# Patient Record
Sex: Female | Born: 1985 | Race: White | Hispanic: No | Marital: Married | Smoking: Never smoker
Health system: Southern US, Community
[De-identification: ages and names within clinical notes are randomized; demographics above are authoritative.]

## PROBLEM LIST (undated history)

## (undated) DIAGNOSIS — F32A Depression, unspecified: Secondary | ICD-10-CM

## (undated) DIAGNOSIS — D649 Anemia, unspecified: Secondary | ICD-10-CM

## (undated) DIAGNOSIS — O24419 Gestational diabetes mellitus in pregnancy, unspecified control: Secondary | ICD-10-CM

## (undated) DIAGNOSIS — F419 Anxiety disorder, unspecified: Secondary | ICD-10-CM

## (undated) DIAGNOSIS — T7840XA Allergy, unspecified, initial encounter: Secondary | ICD-10-CM

## (undated) DIAGNOSIS — J45909 Unspecified asthma, uncomplicated: Secondary | ICD-10-CM

## (undated) HISTORY — DX: Allergy, unspecified, initial encounter: T78.40XA

## (undated) HISTORY — DX: Gestational diabetes mellitus in pregnancy, unspecified control: O24.419

---
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
## 2007-01-01 ENCOUNTER — Other Ambulatory Visit: Admission: RE | Admit: 2007-01-01 | Discharge: 2007-01-01 | Payer: Self-pay | Admitting: Family Medicine

## 2007-06-13 ENCOUNTER — Emergency Department (HOSPITAL_COMMUNITY): Admission: EM | Admit: 2007-06-13 | Discharge: 2007-06-13 | Payer: Self-pay | Admitting: Emergency Medicine

## 2010-09-28 LAB — POCT I-STAT, CHEM 8
BUN: 8
Chloride: 106
HCT: 34 — ABNORMAL LOW
Potassium: 3.8

## 2010-09-28 LAB — DIFFERENTIAL
Basophils Absolute: 0
Basophils Relative: 1
Eosinophils Absolute: 0.1
Eosinophils Relative: 1
Lymphocytes Relative: 39
Lymphs Abs: 3.3
Monocytes Absolute: 0.7
Monocytes Relative: 8
Neutro Abs: 4.3
Neutrophils Relative %: 51

## 2010-09-28 LAB — URINALYSIS, ROUTINE W REFLEX MICROSCOPIC
Bilirubin Urine: NEGATIVE
Glucose, UA: NEGATIVE
Hgb urine dipstick: NEGATIVE
Ketones, ur: NEGATIVE
Nitrite: NEGATIVE
Protein, ur: NEGATIVE
Specific Gravity, Urine: 1.004 — ABNORMAL LOW
Urobilinogen, UA: 0.2
pH: 8

## 2010-09-28 LAB — CBC
HCT: 35.2 — ABNORMAL LOW
Hemoglobin: 12.4
MCHC: 35.1
MCV: 88.9
Platelets: 234
RBC: 3.95
RDW: 12.4
WBC: 8.4

## 2010-09-28 LAB — POCT PREGNANCY, URINE
Operator id: 161631
Preg Test, Ur: NEGATIVE

## 2011-11-23 ENCOUNTER — Other Ambulatory Visit: Payer: Self-pay | Admitting: Family Medicine

## 2011-11-23 ENCOUNTER — Other Ambulatory Visit (HOSPITAL_COMMUNITY)
Admission: RE | Admit: 2011-11-23 | Discharge: 2011-11-23 | Disposition: A | Discharge status: Home / Self Care | Payer: BC Managed Care – PPO | Source: Ambulatory Visit | Attending: Family Medicine | Admitting: Family Medicine

## 2011-11-23 DIAGNOSIS — Z113 Encounter for screening for infections with a predominantly sexual mode of transmission: Secondary | ICD-10-CM | POA: Insufficient documentation

## 2011-11-23 DIAGNOSIS — Z0000 Encounter for general adult medical examination without abnormal findings: Secondary | ICD-10-CM | POA: Insufficient documentation

## 2012-11-18 ENCOUNTER — Other Ambulatory Visit (HOSPITAL_COMMUNITY)
Admission: RE | Admit: 2012-11-18 | Discharge: 2012-11-18 | Disposition: A | Discharge status: Home / Self Care | Payer: BC Managed Care – PPO | Source: Ambulatory Visit | Attending: Nurse Practitioner | Admitting: Nurse Practitioner

## 2012-11-18 DIAGNOSIS — Z01419 Encounter for gynecological examination (general) (routine) without abnormal findings: Secondary | ICD-10-CM | POA: Insufficient documentation

## 2012-11-18 DIAGNOSIS — Z113 Encounter for screening for infections with a predominantly sexual mode of transmission: Secondary | ICD-10-CM | POA: Insufficient documentation

## 2015-03-11 ENCOUNTER — Other Ambulatory Visit: Payer: Self-pay | Admitting: Obstetrics & Gynecology

## 2015-03-11 ENCOUNTER — Other Ambulatory Visit (HOSPITAL_COMMUNITY)
Admission: RE | Admit: 2015-03-11 | Discharge: 2015-03-11 | Disposition: A | Discharge status: Home / Self Care | Payer: Managed Care, Other (non HMO) | Source: Ambulatory Visit | Attending: Obstetrics & Gynecology | Admitting: Obstetrics & Gynecology

## 2015-03-11 DIAGNOSIS — Z01419 Encounter for gynecological examination (general) (routine) without abnormal findings: Secondary | ICD-10-CM | POA: Insufficient documentation

## 2015-03-16 LAB — CYTOLOGY - PAP

## 2017-12-04 DIAGNOSIS — Z0000 Encounter for general adult medical examination without abnormal findings: Secondary | ICD-10-CM | POA: Diagnosis not present

## 2017-12-04 DIAGNOSIS — Z23 Encounter for immunization: Secondary | ICD-10-CM | POA: Diagnosis not present

## 2017-12-04 DIAGNOSIS — Z136 Encounter for screening for cardiovascular disorders: Secondary | ICD-10-CM | POA: Diagnosis not present

## 2018-07-16 DIAGNOSIS — M25462 Effusion, left knee: Secondary | ICD-10-CM | POA: Diagnosis not present

## 2018-08-08 DIAGNOSIS — H04123 Dry eye syndrome of bilateral lacrimal glands: Secondary | ICD-10-CM | POA: Diagnosis not present

## 2018-08-08 DIAGNOSIS — H52223 Regular astigmatism, bilateral: Secondary | ICD-10-CM | POA: Diagnosis not present

## 2018-08-08 DIAGNOSIS — D3131 Benign neoplasm of right choroid: Secondary | ICD-10-CM | POA: Diagnosis not present

## 2018-08-08 DIAGNOSIS — H1045 Other chronic allergic conjunctivitis: Secondary | ICD-10-CM | POA: Diagnosis not present

## 2018-09-19 DIAGNOSIS — H6122 Impacted cerumen, left ear: Secondary | ICD-10-CM | POA: Diagnosis not present

## 2018-10-21 DIAGNOSIS — T781XXA Other adverse food reactions, not elsewhere classified, initial encounter: Secondary | ICD-10-CM | POA: Diagnosis not present

## 2018-10-21 DIAGNOSIS — M722 Plantar fascial fibromatosis: Secondary | ICD-10-CM | POA: Diagnosis not present

## 2018-10-21 DIAGNOSIS — Z91010 Allergy to peanuts: Secondary | ICD-10-CM | POA: Diagnosis not present

## 2018-12-08 DIAGNOSIS — Z13220 Encounter for screening for lipoid disorders: Secondary | ICD-10-CM | POA: Diagnosis not present

## 2018-12-08 DIAGNOSIS — Z0000 Encounter for general adult medical examination without abnormal findings: Secondary | ICD-10-CM | POA: Diagnosis not present

## 2019-02-01 DIAGNOSIS — R0789 Other chest pain: Secondary | ICD-10-CM | POA: Diagnosis not present

## 2019-02-01 DIAGNOSIS — M940 Chondrocostal junction syndrome [Tietze]: Secondary | ICD-10-CM | POA: Diagnosis not present

## 2019-02-05 DIAGNOSIS — Z13220 Encounter for screening for lipoid disorders: Secondary | ICD-10-CM | POA: Diagnosis not present

## 2019-02-11 DIAGNOSIS — Z01419 Encounter for gynecological examination (general) (routine) without abnormal findings: Secondary | ICD-10-CM | POA: Diagnosis not present

## 2019-02-28 DIAGNOSIS — Z713 Dietary counseling and surveillance: Secondary | ICD-10-CM | POA: Diagnosis not present

## 2019-02-28 DIAGNOSIS — Z723 Lack of physical exercise: Secondary | ICD-10-CM | POA: Diagnosis not present

## 2019-02-28 DIAGNOSIS — M722 Plantar fascial fibromatosis: Secondary | ICD-10-CM | POA: Diagnosis not present

## 2019-02-28 DIAGNOSIS — Z724 Inappropriate diet and eating habits: Secondary | ICD-10-CM | POA: Diagnosis not present

## 2019-02-28 DIAGNOSIS — E6609 Other obesity due to excess calories: Secondary | ICD-10-CM | POA: Diagnosis not present

## 2019-03-04 DIAGNOSIS — F419 Anxiety disorder, unspecified: Secondary | ICD-10-CM | POA: Diagnosis not present

## 2019-03-04 DIAGNOSIS — R9431 Abnormal electrocardiogram [ECG] [EKG]: Secondary | ICD-10-CM | POA: Diagnosis not present

## 2019-03-07 DIAGNOSIS — Z724 Inappropriate diet and eating habits: Secondary | ICD-10-CM | POA: Diagnosis not present

## 2019-03-07 DIAGNOSIS — E6609 Other obesity due to excess calories: Secondary | ICD-10-CM | POA: Diagnosis not present

## 2019-03-07 DIAGNOSIS — M722 Plantar fascial fibromatosis: Secondary | ICD-10-CM | POA: Diagnosis not present

## 2019-03-07 DIAGNOSIS — Z713 Dietary counseling and surveillance: Secondary | ICD-10-CM | POA: Diagnosis not present

## 2019-03-07 DIAGNOSIS — Z723 Lack of physical exercise: Secondary | ICD-10-CM | POA: Diagnosis not present

## 2019-03-14 DIAGNOSIS — Z723 Lack of physical exercise: Secondary | ICD-10-CM | POA: Diagnosis not present

## 2019-03-14 DIAGNOSIS — Z713 Dietary counseling and surveillance: Secondary | ICD-10-CM | POA: Diagnosis not present

## 2019-03-14 DIAGNOSIS — M722 Plantar fascial fibromatosis: Secondary | ICD-10-CM | POA: Diagnosis not present

## 2019-03-14 DIAGNOSIS — Z724 Inappropriate diet and eating habits: Secondary | ICD-10-CM | POA: Diagnosis not present

## 2019-03-14 DIAGNOSIS — E6609 Other obesity due to excess calories: Secondary | ICD-10-CM | POA: Diagnosis not present

## 2019-03-21 DIAGNOSIS — E6609 Other obesity due to excess calories: Secondary | ICD-10-CM | POA: Diagnosis not present

## 2019-03-21 DIAGNOSIS — M722 Plantar fascial fibromatosis: Secondary | ICD-10-CM | POA: Diagnosis not present

## 2019-03-21 DIAGNOSIS — Z713 Dietary counseling and surveillance: Secondary | ICD-10-CM | POA: Diagnosis not present

## 2019-03-21 DIAGNOSIS — Z723 Lack of physical exercise: Secondary | ICD-10-CM | POA: Diagnosis not present

## 2019-03-21 DIAGNOSIS — Z724 Inappropriate diet and eating habits: Secondary | ICD-10-CM | POA: Diagnosis not present

## 2019-04-01 DIAGNOSIS — F419 Anxiety disorder, unspecified: Secondary | ICD-10-CM | POA: Diagnosis not present

## 2019-04-04 DIAGNOSIS — M722 Plantar fascial fibromatosis: Secondary | ICD-10-CM | POA: Diagnosis not present

## 2019-04-04 DIAGNOSIS — Z713 Dietary counseling and surveillance: Secondary | ICD-10-CM | POA: Diagnosis not present

## 2019-04-04 DIAGNOSIS — E6609 Other obesity due to excess calories: Secondary | ICD-10-CM | POA: Diagnosis not present

## 2019-04-04 DIAGNOSIS — Z723 Lack of physical exercise: Secondary | ICD-10-CM | POA: Diagnosis not present

## 2019-04-04 DIAGNOSIS — Z724 Inappropriate diet and eating habits: Secondary | ICD-10-CM | POA: Diagnosis not present

## 2019-04-11 DIAGNOSIS — Z713 Dietary counseling and surveillance: Secondary | ICD-10-CM | POA: Diagnosis not present

## 2019-04-11 DIAGNOSIS — Z724 Inappropriate diet and eating habits: Secondary | ICD-10-CM | POA: Diagnosis not present

## 2019-04-11 DIAGNOSIS — E6609 Other obesity due to excess calories: Secondary | ICD-10-CM | POA: Diagnosis not present

## 2019-04-11 DIAGNOSIS — Z723 Lack of physical exercise: Secondary | ICD-10-CM | POA: Diagnosis not present

## 2019-04-11 DIAGNOSIS — M722 Plantar fascial fibromatosis: Secondary | ICD-10-CM | POA: Diagnosis not present

## 2019-04-18 DIAGNOSIS — Z723 Lack of physical exercise: Secondary | ICD-10-CM | POA: Diagnosis not present

## 2019-04-18 DIAGNOSIS — Z724 Inappropriate diet and eating habits: Secondary | ICD-10-CM | POA: Diagnosis not present

## 2019-04-18 DIAGNOSIS — M722 Plantar fascial fibromatosis: Secondary | ICD-10-CM | POA: Diagnosis not present

## 2019-04-18 DIAGNOSIS — E6609 Other obesity due to excess calories: Secondary | ICD-10-CM | POA: Diagnosis not present

## 2019-04-18 DIAGNOSIS — Z713 Dietary counseling and surveillance: Secondary | ICD-10-CM | POA: Diagnosis not present

## 2019-04-25 DIAGNOSIS — M722 Plantar fascial fibromatosis: Secondary | ICD-10-CM | POA: Diagnosis not present

## 2019-04-25 DIAGNOSIS — Z713 Dietary counseling and surveillance: Secondary | ICD-10-CM | POA: Diagnosis not present

## 2019-04-25 DIAGNOSIS — Z724 Inappropriate diet and eating habits: Secondary | ICD-10-CM | POA: Diagnosis not present

## 2019-04-25 DIAGNOSIS — E6609 Other obesity due to excess calories: Secondary | ICD-10-CM | POA: Diagnosis not present

## 2019-04-25 DIAGNOSIS — Z723 Lack of physical exercise: Secondary | ICD-10-CM | POA: Diagnosis not present

## 2019-05-02 DIAGNOSIS — Z724 Inappropriate diet and eating habits: Secondary | ICD-10-CM | POA: Diagnosis not present

## 2019-05-02 DIAGNOSIS — M722 Plantar fascial fibromatosis: Secondary | ICD-10-CM | POA: Diagnosis not present

## 2019-05-02 DIAGNOSIS — Z713 Dietary counseling and surveillance: Secondary | ICD-10-CM | POA: Diagnosis not present

## 2019-05-02 DIAGNOSIS — E6609 Other obesity due to excess calories: Secondary | ICD-10-CM | POA: Diagnosis not present

## 2019-05-02 DIAGNOSIS — Z723 Lack of physical exercise: Secondary | ICD-10-CM | POA: Diagnosis not present

## 2019-05-04 ENCOUNTER — Encounter: Payer: Self-pay | Admitting: Cardiovascular Disease

## 2019-05-04 DIAGNOSIS — R55 Syncope and collapse: Secondary | ICD-10-CM | POA: Diagnosis not present

## 2019-05-04 DIAGNOSIS — R946 Abnormal results of thyroid function studies: Secondary | ICD-10-CM | POA: Diagnosis not present

## 2019-05-04 DIAGNOSIS — R079 Chest pain, unspecified: Secondary | ICD-10-CM | POA: Diagnosis not present

## 2019-05-04 DIAGNOSIS — Z87898 Personal history of other specified conditions: Secondary | ICD-10-CM | POA: Diagnosis not present

## 2019-05-06 NOTE — Progress Notes (Signed)
Cardiology Office Note:   Date:  05/07/2019  NAME:  Marie Morris    MRN: [id number] DOB:  Jun 28, 1985   PCP:  Wilfrid Lund, PA  Cardiologist:  No primary care provider on file.   Referring MD: Aliene Beams, MD   Chief Complaint  Patient presents with  . Near Syncope    History of Present Illness:   Marie Morris is a 34 y.o. female with a hx of allergies who is being seen today for the evaluation of dizziness/presyncope at the request of Wilfrid Lund, [place].  She presents for evaluation of dizziness and presyncope.  On Sunday of this week she reports she was walking at [employer] around 11:30 AM and became very dizzy and lightheaded.  She reports she felt she was in a pass out but did not.  She was approached by a medical professional who instructed her to sit down and lay down the ground.  She apparently felt unwell most of the day.  She was evaluated by emergency medical services and her blood pressure was noted to be 140/100 and blood sugar 115.  This was after consuming some sugary stuff.  She reports do not have any chest pain or shortness of breath with the episode.  She denies any sensation of rapid heartbeat.  She reports that she just did not feel well most of the day.  Of note in her history she is recently started a weight loss program that includes around 900 to 1200 cal/day.  She reports has been staying well-hydrated.  Apparently all she ate on the day in question was the yogurt that morning.  She does report that she does drink Gatorade 0 and other things to maintain hydration as she works in his diet.  She does exercise on a Peloton several days per week and has no issues doing that.  Her EKG today demonstrates normal sinus rhythm with no acute changes.  Her primary care physician mentions a prolonged QT interval which she does not have.  Her EKG is very normal.  She reports that her father has a history of heart disease.  She is a never smoker.  She rarely consumes alcohol.  No  illicit drug use.  She works as an Airline pilot.  She is married with no children and they have we will try to have children soon.  Most recent thyroid studies from primary care physician office 2.8.  Most recent hemoglobin 13.5.  Review of lipid profile from 2021 is unremarkable.  Her cardiovascular exam is unremarkable as well.  Past Medical History: Past Medical History:  Diagnosis Date  . Allergies     Past Surgical History: History reviewed. No pertinent surgical history.  Current Medications: Current Meds  Medication Sig  . cetirizine (ZYRTEC) 10 MG tablet Take 10 mg by mouth daily.  Marland Kitchen EPINEPHrine 0.3 mg/0.3 mL IJ SOAJ injection See admin instructions.  Marland Kitchen FLUoxetine (PROZAC) 20 MG capsule Take 20 mg by mouth daily.  . Melatonin-Pyridoxine (MELATIN PO) Take 3 mg by mouth.  . Prenatal Vit-Fe Fum-FA-Omega (PRENATAL MULTI +DHA PO) Take by mouth.  Marland Kitchen Specialty Vitamins Products (WEIGHT LOSS DAILY MULTI PO) Take by mouth. Patient takes 3 different weight loss pills OTC  1. Inner balance  2. Fat burner  3. Stay slim  Patient states that the brand call medi weight loss     Allergies:    Patient has no known allergies.   Social History: Social History   Socioeconomic History  .  Marital status: Married    Spouse name: Not on file  . Number of children: Not on file  . Years of education: Not on file  . Highest education level: Not on file  Occupational History  . Not on file  Tobacco Use  . Smoking status: Never Smoker  . Smokeless tobacco: Never Used  Substance and Sexual Activity  . Alcohol use: Not Currently  . Drug use: Never  . Sexual activity: Not on file  Other Topics Concern  . Not on file  Social History Narrative  . Not on file   Social Determinants of Health   Financial Resource Strain:   . Difficulty of Paying Living Expenses:   Food Insecurity:   . Worried About Charity fundraiser in the Last Year:   . Arboriculturist in the Last Year:   Transportation  Needs:   . Film/video editor (Medical):   Marland Kitchen Lack of Transportation (Non-Medical):   Physical Activity:   . Days of Exercise per Week:   . Minutes of Exercise per Session:   Stress:   . Feeling of Stress :   Social Connections:   . Frequency of Communication with Friends and Family:   . Frequency of Social Gatherings with Friends and Family:   . Attends Religious Services:   . Active Member of Clubs or Organizations:   . Attends Archivist Meetings:   Marland Kitchen Marital Status:      Family History: The patient's family history includes Hypertension in her father.  ROS:   All other ROS reviewed and negative. Pertinent positives noted in the HPI.     EKGs/Labs/Other Studies Reviewed:   The following studies were personally reviewed by me today:  EKG:  EKG is ordered today.  The ekg ordered today demonstrates normal sinus rhythm, heart rate 77, no acute ischemic changes, normal intervals, QTC 425, and was personally reviewed by me.   Recent Labs: No results found for requested labs within last 8760 hours.   Recent Lipid Panel No results found for: CHOL, TRIG, HDL, CHOLHDL, VLDL, LDLCALC, LDLDIRECT  Physical Exam:   VS:  BP 110/86   Pulse 77   Ht 5\' 1"  (1.549 m)   Wt 164 lb (74.4 kg)   SpO2 99%   BMI 30.99 kg/m    Wt Readings from Last 3 Encounters:  05/07/19 164 lb (74.4 kg)    General: Well nourished, well developed, in no acute distress Heart: Atraumatic, normal size  Eyes: PEERLA, EOMI  Neck: Supple, no JVD Endocrine: No thryomegaly Cardiac: Normal S1, S2; RRR; no murmurs, rubs, or gallops Lungs: Clear to auscultation bilaterally, no wheezing, rhonchi or rales  Abd: Soft, nontender, no hepatomegaly  Ext: No edema, pulses 2+ Musculoskeletal: No deformities, BUE and BLE strength normal and equal Skin: Warm and dry, no rashes   Neuro: Alert and oriented to person, place, time, and situation, CNII-XII grossly intact, no focal deficits  Psych: Normal mood and  affect   ASSESSMENT:   Marie Morris is a 34 y.o. female who presents for the following: 1. Postural dizziness with presyncope     PLAN:   1. Postural dizziness with presyncope -She presents after a presyncopal episode.  She had no alarming features such as syncope, chest pain, shortness of breath or palpitations.  Her EKG today is normal.  She has no evidence of prolongation of her QT interval.  All of her intervals are normal.  Thyroid studies by primary care  physician were normal.  She has recently started a pretty intense crash diet where she consumes only 900 to 1200 cal/day.  She does remain well-hydrated but I do suspect this is the issue here.  She apparently woke up that morning and went to [employer] was walking around when she felt symptoms of presyncope.  The dizziness and lightheadedness are no alarming to me.  She did have blurry vision.  This is also consistent with likely a vasovagal event she was about to have.  She did circumvent this and did not feel well most of the day.  Her EKG is normal, her cardiovascular exam is without murmurs rubs or gallops.  I have recommended no further cardiac work-up.  I recommended to stay well-hydrated and consume a fair amount of food before she does exercise.  She will see [place] as needed.   Disposition: Return if symptoms worsen or fail to improve.  Medication Adjustments/Labs and Tests Ordered: Current medicines are reviewed at length with the patient today.  Concerns regarding medicines are outlined above.  Orders Placed This Encounter  Procedures  . EKG 12-Lead   No orders of the defined types were placed in this encounter.   Patient Instructions  Medication Instructions:  The current medical regimen is effective;  continue present plan and medications.  *If you need a refill on your cardiac medications before your next appointment, please call your pharmacy*   Follow-Up: At [facility], you and your health needs are our priority.   As part of our continuing mission to provide you with exceptional heart care, we have created designated Provider Care Teams.  These Care Teams include your primary Cardiologist (physician) and Advanced Practice Providers (APPs -  Physician Assistants and Nurse Practitioners) who all work together to provide you with the care you need, when you need it.  We recommend signing up for the patient portal called "MyChart".  Sign up information is provided on this After Visit Summary.  MyChart is used to connect with patients for Virtual Visits (Telemedicine).  Patients are able to view lab/test results, encounter notes, upcoming appointments, etc.  Non-urgent messages can be sent to your provider as well.   To learn more about what you can do with MyChart, go to [email or web address].    Your next appointment:   As needed  The format for your next appointment:   In Person  Provider:   Lennie Odor, MD        Signed, Lenna Gilford. Flora Lipps, MD [facility]  [address], [address] [place], [place] [address] [phone]  05/07/2019 10:06 AM

## 2019-05-07 ENCOUNTER — Encounter: Payer: Self-pay | Admitting: Cardiovascular Disease

## 2019-05-07 ENCOUNTER — Other Ambulatory Visit: Payer: Self-pay

## 2019-05-07 ENCOUNTER — Ambulatory Visit (INDEPENDENT_AMBULATORY_CARE_PROVIDER_SITE_OTHER): Payer: BC Managed Care – PPO | Admitting: Cardiovascular Disease

## 2019-05-07 VITALS — BP 110/86 | HR 77 | Ht 61.0 in | Wt 164.0 lb

## 2019-05-07 DIAGNOSIS — R42 Dizziness and giddiness: Secondary | ICD-10-CM | POA: Diagnosis not present

## 2019-05-07 DIAGNOSIS — R55 Syncope and collapse: Secondary | ICD-10-CM | POA: Diagnosis not present

## 2019-05-07 NOTE — Patient Instructions (Signed)
Medication Instructions:  The current medical regimen is effective;  continue present plan and medications.  *If you need a refill on your cardiac medications before your next appointment, please call your pharmacy*    Follow-Up: At [facility], you and your health needs are our priority.  As part of our continuing mission to provide you with exceptional heart care, we have created designated Provider Care Teams.  These Care Teams include your primary Cardiologist (physician) and Advanced Practice Providers (APPs -  Physician Assistants and Nurse Practitioners) who all work together to provide you with the care you need, when you need it.  We recommend signing up for the patient portal called "MyChart".  Sign up information is provided on this After Visit Summary.  MyChart is used to connect with patients for Virtual Visits (Telemedicine).  Patients are able to view lab/test results, encounter notes, upcoming appointments, etc.  Non-urgent messages can be sent to your provider as well.   To learn more about what you can do with MyChart, go to [email or web address].    Your next appointment:   As needed  The format for your next appointment:   In Person  Provider:   [place] O'[place], MD      

## 2019-05-09 DIAGNOSIS — E6609 Other obesity due to excess calories: Secondary | ICD-10-CM | POA: Diagnosis not present

## 2019-05-09 DIAGNOSIS — Z723 Lack of physical exercise: Secondary | ICD-10-CM | POA: Diagnosis not present

## 2019-05-09 DIAGNOSIS — Z724 Inappropriate diet and eating habits: Secondary | ICD-10-CM | POA: Diagnosis not present

## 2019-05-09 DIAGNOSIS — Z713 Dietary counseling and surveillance: Secondary | ICD-10-CM | POA: Diagnosis not present

## 2019-05-09 DIAGNOSIS — M722 Plantar fascial fibromatosis: Secondary | ICD-10-CM | POA: Diagnosis not present

## 2019-05-16 DIAGNOSIS — Z724 Inappropriate diet and eating habits: Secondary | ICD-10-CM | POA: Diagnosis not present

## 2019-05-16 DIAGNOSIS — Z723 Lack of physical exercise: Secondary | ICD-10-CM | POA: Diagnosis not present

## 2019-05-16 DIAGNOSIS — E6609 Other obesity due to excess calories: Secondary | ICD-10-CM | POA: Diagnosis not present

## 2019-05-16 DIAGNOSIS — M722 Plantar fascial fibromatosis: Secondary | ICD-10-CM | POA: Diagnosis not present

## 2019-05-16 DIAGNOSIS — Z713 Dietary counseling and surveillance: Secondary | ICD-10-CM | POA: Diagnosis not present

## 2019-05-23 DIAGNOSIS — E6609 Other obesity due to excess calories: Secondary | ICD-10-CM | POA: Diagnosis not present

## 2019-05-23 DIAGNOSIS — Z723 Lack of physical exercise: Secondary | ICD-10-CM | POA: Diagnosis not present

## 2019-05-23 DIAGNOSIS — Z724 Inappropriate diet and eating habits: Secondary | ICD-10-CM | POA: Diagnosis not present

## 2019-05-23 DIAGNOSIS — Z713 Dietary counseling and surveillance: Secondary | ICD-10-CM | POA: Diagnosis not present

## 2019-05-23 DIAGNOSIS — M722 Plantar fascial fibromatosis: Secondary | ICD-10-CM | POA: Diagnosis not present

## 2019-05-30 DIAGNOSIS — Z723 Lack of physical exercise: Secondary | ICD-10-CM | POA: Diagnosis not present

## 2019-05-30 DIAGNOSIS — Z724 Inappropriate diet and eating habits: Secondary | ICD-10-CM | POA: Diagnosis not present

## 2019-05-30 DIAGNOSIS — M722 Plantar fascial fibromatosis: Secondary | ICD-10-CM | POA: Diagnosis not present

## 2019-05-30 DIAGNOSIS — Z713 Dietary counseling and surveillance: Secondary | ICD-10-CM | POA: Diagnosis not present

## 2019-05-30 DIAGNOSIS — E663 Overweight: Secondary | ICD-10-CM | POA: Diagnosis not present

## 2019-06-19 DIAGNOSIS — B373 Candidiasis of vulva and vagina: Secondary | ICD-10-CM | POA: Diagnosis not present

## 2019-06-19 DIAGNOSIS — N9089 Other specified noninflammatory disorders of vulva and perineum: Secondary | ICD-10-CM | POA: Diagnosis not present

## 2019-10-06 DIAGNOSIS — Z630 Problems in relationship with spouse or partner: Secondary | ICD-10-CM | POA: Diagnosis not present

## 2019-10-06 DIAGNOSIS — F411 Generalized anxiety disorder: Secondary | ICD-10-CM | POA: Diagnosis not present

## 2019-10-15 DIAGNOSIS — E669 Obesity, unspecified: Secondary | ICD-10-CM | POA: Diagnosis not present

## 2019-10-15 DIAGNOSIS — Z30432 Encounter for removal of intrauterine contraceptive device: Secondary | ICD-10-CM | POA: Diagnosis not present

## 2019-10-15 DIAGNOSIS — E559 Vitamin D deficiency, unspecified: Secondary | ICD-10-CM | POA: Diagnosis not present

## 2019-10-15 DIAGNOSIS — Z319 Encounter for procreative management, unspecified: Secondary | ICD-10-CM | POA: Diagnosis not present

## 2019-10-15 DIAGNOSIS — Z3169 Encounter for other general counseling and advice on procreation: Secondary | ICD-10-CM | POA: Diagnosis not present

## 2019-10-15 DIAGNOSIS — Z315 Encounter for genetic counseling: Secondary | ICD-10-CM | POA: Diagnosis not present

## 2019-11-03 DIAGNOSIS — F411 Generalized anxiety disorder: Secondary | ICD-10-CM | POA: Diagnosis not present

## 2019-11-03 DIAGNOSIS — Z630 Problems in relationship with spouse or partner: Secondary | ICD-10-CM | POA: Diagnosis not present

## 2019-11-20 DIAGNOSIS — Z20822 Contact with and (suspected) exposure to covid-19: Secondary | ICD-10-CM | POA: Diagnosis not present

## 2019-11-25 DIAGNOSIS — L309 Dermatitis, unspecified: Secondary | ICD-10-CM | POA: Diagnosis not present

## 2019-11-25 DIAGNOSIS — J309 Allergic rhinitis, unspecified: Secondary | ICD-10-CM | POA: Diagnosis not present

## 2019-12-10 DIAGNOSIS — Z630 Problems in relationship with spouse or partner: Secondary | ICD-10-CM | POA: Diagnosis not present

## 2019-12-10 DIAGNOSIS — F411 Generalized anxiety disorder: Secondary | ICD-10-CM | POA: Diagnosis not present

## 2019-12-21 DIAGNOSIS — Z3201 Encounter for pregnancy test, result positive: Secondary | ICD-10-CM | POA: Diagnosis not present

## 2019-12-21 DIAGNOSIS — Z3400 Encounter for supervision of normal first pregnancy, unspecified trimester: Secondary | ICD-10-CM | POA: Diagnosis not present

## 2019-12-21 DIAGNOSIS — O26899 Other specified pregnancy related conditions, unspecified trimester: Secondary | ICD-10-CM | POA: Diagnosis not present

## 2019-12-29 DIAGNOSIS — Z20822 Contact with and (suspected) exposure to covid-19: Secondary | ICD-10-CM | POA: Diagnosis not present

## 2020-01-02 DIAGNOSIS — O24419 Gestational diabetes mellitus in pregnancy, unspecified control: Secondary | ICD-10-CM

## 2020-01-02 HISTORY — DX: Gestational diabetes mellitus in pregnancy, unspecified control: O24.419

## 2020-01-02 NOTE — L&D Delivery Note (Signed)
Delivery Note Arrived to room to evaluate for cervical change. Patient was found to be complete and 0 to +1 station. The bed was broken down. Fetal head was delivered. No nuchal was present. Shoulders and body followed without difficulty. Infant was placed on mother's chest, mouth and nose were bulb suctioned and let out a vigorous cry. Delayed cord clamping was performed for 60 seconds. Venous cord blood was collected. Placenta delivered spontaneously. Cervix, vagina, perineum and labia were inspected. Uterine fundus firm. A deep second degree laceration was found. A rectal examination revealed intact rectal mucosa and external sphincter intact. After change of sterile gloves, the second degree laceration was repaired using 3-0 Vicryl. There was significant oozing from multiple sites which were made hemostatic using 3-0 Vicryl using figure-of-eights. Cytotec was placed per rectum as prophylaxis and 1g TXA IV administered. Most of the blood loss was from the vaginal laceration. Placenta was inspected, found to be intact with a 3 vessel cord. Counts were correct.   At 11:08 AM a viable female was delivered via Vaginal, Spontaneous (Presentation:   Left Occiput Anterior).  APGAR: 9, 9; weight: see infant's chart.   Placenta status: Spontaneous, Intact.  Cord: 3 vessels with the following complications: None.  Cord pH: Not collected  Anesthesia: Epidural Episiotomy: None Lacerations: 2nd degree;Perineal Suture Repair: 3.0 vicryl Est. Blood Loss (mL):  821  Mom to postpartum.  Baby to Couplet care / Skin to Skin.  Steva Ready 08/14/2020, 11:54 AM

## 2020-01-04 DIAGNOSIS — O26899 Other specified pregnancy related conditions, unspecified trimester: Secondary | ICD-10-CM | POA: Diagnosis not present

## 2020-01-05 DIAGNOSIS — Z630 Problems in relationship with spouse or partner: Secondary | ICD-10-CM | POA: Diagnosis not present

## 2020-01-05 DIAGNOSIS — F411 Generalized anxiety disorder: Secondary | ICD-10-CM | POA: Diagnosis not present

## 2020-01-12 LAB — OB RESULTS CONSOLE HEPATITIS B SURFACE ANTIGEN: Hepatitis B Surface Ag: NEGATIVE

## 2020-01-12 LAB — OB RESULTS CONSOLE ABO/RH: RH Type: POSITIVE

## 2020-01-12 LAB — OB RESULTS CONSOLE GC/CHLAMYDIA
Chlamydia: NEGATIVE
Gonorrhea: NEGATIVE

## 2020-01-12 LAB — OB RESULTS CONSOLE RPR: RPR: NONREACTIVE

## 2020-01-12 LAB — OB RESULTS CONSOLE RUBELLA ANTIBODY, IGM: Rubella: IMMUNE

## 2020-01-12 LAB — OB RESULTS CONSOLE ANTIBODY SCREEN: Antibody Screen: NEGATIVE

## 2020-01-12 LAB — OB RESULTS CONSOLE HIV ANTIBODY (ROUTINE TESTING): HIV: NONREACTIVE

## 2020-01-13 DIAGNOSIS — R0981 Nasal congestion: Secondary | ICD-10-CM | POA: Diagnosis not present

## 2020-01-13 DIAGNOSIS — R6883 Chills (without fever): Secondary | ICD-10-CM | POA: Diagnosis not present

## 2020-01-13 DIAGNOSIS — B349 Viral infection, unspecified: Secondary | ICD-10-CM | POA: Diagnosis not present

## 2020-01-29 DIAGNOSIS — Z3490 Encounter for supervision of normal pregnancy, unspecified, unspecified trimester: Secondary | ICD-10-CM | POA: Diagnosis not present

## 2020-02-02 DIAGNOSIS — F411 Generalized anxiety disorder: Secondary | ICD-10-CM | POA: Diagnosis not present

## 2020-02-02 DIAGNOSIS — Z630 Problems in relationship with spouse or partner: Secondary | ICD-10-CM | POA: Diagnosis not present

## 2020-02-25 DIAGNOSIS — N898 Other specified noninflammatory disorders of vagina: Secondary | ICD-10-CM | POA: Diagnosis not present

## 2020-03-01 DIAGNOSIS — F411 Generalized anxiety disorder: Secondary | ICD-10-CM | POA: Diagnosis not present

## 2020-03-01 DIAGNOSIS — Z630 Problems in relationship with spouse or partner: Secondary | ICD-10-CM | POA: Diagnosis not present

## 2020-03-28 DIAGNOSIS — Z3490 Encounter for supervision of normal pregnancy, unspecified, unspecified trimester: Secondary | ICD-10-CM | POA: Diagnosis not present

## 2020-03-28 DIAGNOSIS — Z36 Encounter for antenatal screening for chromosomal anomalies: Secondary | ICD-10-CM | POA: Diagnosis not present

## 2020-03-28 DIAGNOSIS — Z3402 Encounter for supervision of normal first pregnancy, second trimester: Secondary | ICD-10-CM | POA: Diagnosis not present

## 2020-04-05 DIAGNOSIS — F411 Generalized anxiety disorder: Secondary | ICD-10-CM | POA: Diagnosis not present

## 2020-04-05 DIAGNOSIS — Z630 Problems in relationship with spouse or partner: Secondary | ICD-10-CM | POA: Diagnosis not present

## 2020-04-29 DIAGNOSIS — Z3402 Encounter for supervision of normal first pregnancy, second trimester: Secondary | ICD-10-CM | POA: Diagnosis not present

## 2020-04-29 DIAGNOSIS — Z3490 Encounter for supervision of normal pregnancy, unspecified, unspecified trimester: Secondary | ICD-10-CM | POA: Diagnosis not present

## 2020-05-03 DIAGNOSIS — Z630 Problems in relationship with spouse or partner: Secondary | ICD-10-CM | POA: Diagnosis not present

## 2020-05-03 DIAGNOSIS — F411 Generalized anxiety disorder: Secondary | ICD-10-CM | POA: Diagnosis not present

## 2020-05-05 DIAGNOSIS — O99810 Abnormal glucose complicating pregnancy: Secondary | ICD-10-CM | POA: Diagnosis not present

## 2020-05-18 ENCOUNTER — Encounter: Payer: BC Managed Care – PPO | Attending: Obstetrics and Gynecology | Admitting: Registered"

## 2020-05-18 ENCOUNTER — Other Ambulatory Visit: Payer: Self-pay

## 2020-05-18 ENCOUNTER — Encounter: Payer: Self-pay | Admitting: Registered"

## 2020-05-18 DIAGNOSIS — O24419 Gestational diabetes mellitus in pregnancy, unspecified control: Secondary | ICD-10-CM | POA: Diagnosis not present

## 2020-05-18 NOTE — Progress Notes (Signed)
Patient was seen on 05/18/2020 for Gestational Diabetes self-management class at the [facility]. The following learning objectives were met by the patient during this course:   States the definition of Gestational Diabetes  States why dietary management is important in controlling blood glucose  Describes the effects each nutrient has on blood glucose levels  Demonstrates ability to create a balanced meal plan  Demonstrates carbohydrate counting   States when to check blood glucose levels  Demonstrates proper blood glucose monitoring techniques  States the effect of stress and exercise on blood glucose levels  States the importance of limiting caffeine and abstaining from alcohol and smoking  Blood glucose monitor given: Patient has meter and is checking blood sugar prior to class  Patient instructed to monitor glucose levels: FBS: 60 - <95; 1 hour: <140; 2 hour: <120  Patient received handouts:  Nutrition Diabetes and Pregnancy, including carb counting list  Patient will be seen for follow-up as needed. 

## 2020-06-02 DIAGNOSIS — F411 Generalized anxiety disorder: Secondary | ICD-10-CM | POA: Diagnosis not present

## 2020-06-02 DIAGNOSIS — Z630 Problems in relationship with spouse or partner: Secondary | ICD-10-CM | POA: Diagnosis not present

## 2020-06-07 DIAGNOSIS — O99213 Obesity complicating pregnancy, third trimester: Secondary | ICD-10-CM | POA: Diagnosis not present

## 2020-06-07 DIAGNOSIS — Z23 Encounter for immunization: Secondary | ICD-10-CM | POA: Diagnosis not present

## 2020-06-07 DIAGNOSIS — O24913 Unspecified diabetes mellitus in pregnancy, third trimester: Secondary | ICD-10-CM | POA: Diagnosis not present

## 2020-06-20 DIAGNOSIS — O99213 Obesity complicating pregnancy, third trimester: Secondary | ICD-10-CM | POA: Diagnosis not present

## 2020-06-20 DIAGNOSIS — O24913 Unspecified diabetes mellitus in pregnancy, third trimester: Secondary | ICD-10-CM | POA: Diagnosis not present

## 2020-06-27 DIAGNOSIS — O24913 Unspecified diabetes mellitus in pregnancy, third trimester: Secondary | ICD-10-CM | POA: Diagnosis not present

## 2020-06-27 DIAGNOSIS — O99213 Obesity complicating pregnancy, third trimester: Secondary | ICD-10-CM | POA: Diagnosis not present

## 2020-06-27 DIAGNOSIS — R030 Elevated blood-pressure reading, without diagnosis of hypertension: Secondary | ICD-10-CM | POA: Diagnosis not present

## 2020-07-05 DIAGNOSIS — O24913 Unspecified diabetes mellitus in pregnancy, third trimester: Secondary | ICD-10-CM | POA: Diagnosis not present

## 2020-07-05 DIAGNOSIS — O99213 Obesity complicating pregnancy, third trimester: Secondary | ICD-10-CM | POA: Diagnosis not present

## 2020-07-07 DIAGNOSIS — F411 Generalized anxiety disorder: Secondary | ICD-10-CM | POA: Diagnosis not present

## 2020-07-07 DIAGNOSIS — Z630 Problems in relationship with spouse or partner: Secondary | ICD-10-CM | POA: Diagnosis not present

## 2020-07-12 DIAGNOSIS — Z3490 Encounter for supervision of normal pregnancy, unspecified, unspecified trimester: Secondary | ICD-10-CM | POA: Diagnosis not present

## 2020-07-12 DIAGNOSIS — Z3403 Encounter for supervision of normal first pregnancy, third trimester: Secondary | ICD-10-CM | POA: Diagnosis not present

## 2020-07-12 DIAGNOSIS — O99213 Obesity complicating pregnancy, third trimester: Secondary | ICD-10-CM | POA: Diagnosis not present

## 2020-07-12 DIAGNOSIS — O24913 Unspecified diabetes mellitus in pregnancy, third trimester: Secondary | ICD-10-CM | POA: Diagnosis not present

## 2020-07-18 DIAGNOSIS — O99213 Obesity complicating pregnancy, third trimester: Secondary | ICD-10-CM | POA: Diagnosis not present

## 2020-07-18 DIAGNOSIS — O24913 Unspecified diabetes mellitus in pregnancy, third trimester: Secondary | ICD-10-CM | POA: Diagnosis not present

## 2020-07-25 DIAGNOSIS — O24913 Unspecified diabetes mellitus in pregnancy, third trimester: Secondary | ICD-10-CM | POA: Diagnosis not present

## 2020-07-25 DIAGNOSIS — O99213 Obesity complicating pregnancy, third trimester: Secondary | ICD-10-CM | POA: Diagnosis not present

## 2020-07-26 DIAGNOSIS — M6281 Muscle weakness (generalized): Secondary | ICD-10-CM | POA: Diagnosis not present

## 2020-08-01 DIAGNOSIS — O99213 Obesity complicating pregnancy, third trimester: Secondary | ICD-10-CM | POA: Diagnosis not present

## 2020-08-01 DIAGNOSIS — O24913 Unspecified diabetes mellitus in pregnancy, third trimester: Secondary | ICD-10-CM | POA: Diagnosis not present

## 2020-08-04 DIAGNOSIS — Z630 Problems in relationship with spouse or partner: Secondary | ICD-10-CM | POA: Diagnosis not present

## 2020-08-04 DIAGNOSIS — F411 Generalized anxiety disorder: Secondary | ICD-10-CM | POA: Diagnosis not present

## 2020-08-05 ENCOUNTER — Telehealth (HOSPITAL_COMMUNITY): Payer: Self-pay | Admitting: *Deleted

## 2020-08-05 ENCOUNTER — Encounter (HOSPITAL_COMMUNITY): Payer: Self-pay | Admitting: *Deleted

## 2020-08-05 NOTE — Telephone Encounter (Signed)
Preadmission screen  

## 2020-08-09 DIAGNOSIS — O24913 Unspecified diabetes mellitus in pregnancy, third trimester: Secondary | ICD-10-CM | POA: Diagnosis not present

## 2020-08-10 ENCOUNTER — Other Ambulatory Visit: Payer: Self-pay | Admitting: Obstetrics and Gynecology

## 2020-08-10 LAB — SARS CORONAVIRUS 2 (TAT 6-24 HRS): SARS Coronavirus 2: NEGATIVE

## 2020-08-10 NOTE — H&P (Signed)
HPI: 35 y.o. G1P0 @ [redacted]w[redacted]d estimated gestational age (as dated by LMP c/w 7 week ultrasound) presents for induction of labor for Class A2DM.  Leakage of fluid:  No Vaginal bleeding:  No Contractions:  No Fetal movement:  Yes  Prenatal care has been provided by Dr. Steva Ready [facility])  ROS:  Denies fevers, chills, chest pain, visual changes, SOB, RUQ/epigastric pain, N/V, dysuria, hematuria, or sudden onset/worsening bilateral LE or facial edema.  Pregnancy complicated by: Class A2DM Obesity (BMI 34) Anxiety   Prenatal Transfer Tool  Maternal Diabetes: Yes:  Diabetes Type:  Insulin/Medication controlled Genetic Screening: Normal Maternal Ultrasounds/Referrals: Normal Fetal Ultrasounds or other Referrals:  None Maternal Substance Abuse:  No Significant Maternal Medications:  Meds include: Other:  Metformin 500mg  qAM and 500mg  qPM, Fluoxetine 20mg  daily Significant Maternal Lab Results: Group B Strep negative   Prenatal Labs Blood type:  O Positive Antibody screen:  Negative CBC:  H/H 11.5/32.0 Rubella: Immune RPR:  Non-reactive Hep B:  Negative Hep C:  Negative HIV:  Negative GC/CT:  Negative Glucola: 164  3h GTT abnormal  Immunizations: Tdap: Given prenatally Flu: Received Nov 2021  OBHx:  OB History     Gravida  1   Para      Term      Preterm      AB      Living         SAB      IAB      Ectopic      Multiple      Live Births             PMHx:  See above Meds:  PNV, Metformin 500mg  qAM and 1000mg  qPM, Fluoxetine 20mg  daily Allergy:   Allergies  Allergen Reactions   Almond (Diagnostic)    Apple    Cherry    Hazelnut ([place]) Allergy Skin Test    Peanut (Diagnostic)    Plum Pulp    SurgHx: None SocHx:   Denies Tobacco, ETOH, illicit drugs  O: There were no vitals taken for this visit. Gen. AAOx3, NAD CV.  RRR  Resp. CTAB, no wheezes/rales/rhonchi Abd. Gravid, soft, non-tender throughout, no rebound/guarding Extr.   Trace bilateral LE edema, no calf tenderness bilaterally SVE: closed/thick/high    Last (8/9): EFW 3846g (81%), AAFV, cephalic, anterior placenta, BPP 8/8    Labs: see orders  A/P:  35 y.o. G1P0 @ [redacted]w[redacted]d who is admitted for induction of labor for Class A2DM.  IOL - Class A2DM - Admit to L&D - Admit labs (CBC, T&S, COVID screen) - CEFM/Toco - Diet:  Clear liquids - IVF:  LR at 125cc/hour - VTE Prophylaxis:  SCDs - GBS Status:  Negative - Presentation:  Confirm prior to IOL, fetus has been cephalic on last ultrasounds - Pain control:  Per patient request - Induction method:  Cytotec for cervical ripening  - Anticipate SVD  Class A2DM - Medications: Metformin 500mg  qAM and 1000mg  qPM (will hold) - CBG q6h - Will add sliding scale or insulin gtt if needed - Needs 2h GTT postpartum  Anxiety  - Mood stable - Medication: Fluoxetine 20mg  daily   Obesity (BMI 34) - Encourage frequent/aggressive ambulation postpartum   , DO [phone] (office)

## 2020-08-11 DIAGNOSIS — E669 Obesity, unspecified: Secondary | ICD-10-CM

## 2020-08-11 DIAGNOSIS — F419 Anxiety disorder, unspecified: Secondary | ICD-10-CM

## 2020-08-12 ENCOUNTER — Inpatient Hospital Stay (HOSPITAL_COMMUNITY)
Admission: AD | Admit: 2020-08-12 | Discharge: 2020-08-16 | DRG: 807 | Disposition: A | Discharge status: Home / Self Care | Payer: BC Managed Care – PPO | Attending: Obstetrics and Gynecology | Admitting: Obstetrics and Gynecology

## 2020-08-12 ENCOUNTER — Inpatient Hospital Stay (HOSPITAL_COMMUNITY): Payer: BC Managed Care – PPO

## 2020-08-12 ENCOUNTER — Inpatient Hospital Stay (HOSPITAL_BASED_OUTPATIENT_CLINIC_OR_DEPARTMENT_OTHER): Payer: BC Managed Care – PPO

## 2020-08-12 ENCOUNTER — Other Ambulatory Visit: Payer: Self-pay

## 2020-08-12 ENCOUNTER — Encounter (HOSPITAL_COMMUNITY): Payer: Self-pay | Admitting: Obstetrics and Gynecology

## 2020-08-12 DIAGNOSIS — Z23 Encounter for immunization: Secondary | ICD-10-CM

## 2020-08-12 DIAGNOSIS — O24419 Gestational diabetes mellitus in pregnancy, unspecified control: Secondary | ICD-10-CM | POA: Diagnosis present

## 2020-08-12 DIAGNOSIS — Z3A39 39 weeks gestation of pregnancy: Secondary | ICD-10-CM

## 2020-08-12 DIAGNOSIS — O411230 Chorioamnionitis, third trimester, not applicable or unspecified: Secondary | ICD-10-CM | POA: Diagnosis not present

## 2020-08-12 DIAGNOSIS — O24425 Gestational diabetes mellitus in childbirth, controlled by oral hypoglycemic drugs: Secondary | ICD-10-CM | POA: Diagnosis not present

## 2020-08-12 DIAGNOSIS — O99344 Other mental disorders complicating childbirth: Secondary | ICD-10-CM | POA: Diagnosis present

## 2020-08-12 DIAGNOSIS — F419 Anxiety disorder, unspecified: Secondary | ICD-10-CM | POA: Diagnosis present

## 2020-08-12 DIAGNOSIS — Z3689 Encounter for other specified antenatal screening: Secondary | ICD-10-CM

## 2020-08-12 DIAGNOSIS — O9902 Anemia complicating childbirth: Secondary | ICD-10-CM | POA: Diagnosis not present

## 2020-08-12 DIAGNOSIS — Z412 Encounter for routine and ritual male circumcision: Secondary | ICD-10-CM | POA: Diagnosis not present

## 2020-08-12 DIAGNOSIS — O99214 Obesity complicating childbirth: Secondary | ICD-10-CM | POA: Diagnosis present

## 2020-08-12 DIAGNOSIS — D649 Anemia, unspecified: Secondary | ICD-10-CM | POA: Diagnosis not present

## 2020-08-12 DIAGNOSIS — O24429 Gestational diabetes mellitus in childbirth, unspecified control: Secondary | ICD-10-CM | POA: Diagnosis not present

## 2020-08-12 DIAGNOSIS — E669 Obesity, unspecified: Secondary | ICD-10-CM

## 2020-08-12 DIAGNOSIS — Z3A00 Weeks of gestation of pregnancy not specified: Secondary | ICD-10-CM | POA: Diagnosis not present

## 2020-08-12 DIAGNOSIS — Z0542 Observation and evaluation of newborn for suspected metabolic condition ruled out: Secondary | ICD-10-CM | POA: Diagnosis not present

## 2020-08-12 LAB — RPR: RPR Ser Ql: NONREACTIVE

## 2020-08-12 LAB — CBC
HCT: 32.3 % — ABNORMAL LOW (ref 36.0–46.0)
Hemoglobin: 10.7 g/dL — ABNORMAL LOW (ref 12.0–15.0)
MCH: 30.1 pg (ref 26.0–34.0)
MCHC: 33.1 g/dL (ref 30.0–36.0)
MCV: 90.7 fL (ref 80.0–100.0)
Platelets: 238 10*3/uL (ref 150–400)
RBC: 3.56 MIL/uL — ABNORMAL LOW (ref 3.87–5.11)
RDW: 14.4 % (ref 11.5–15.5)
WBC: 8.4 10*3/uL (ref 4.0–10.5)
nRBC: 0 % (ref 0.0–0.2)

## 2020-08-12 LAB — GLUCOSE, CAPILLARY
Glucose-Capillary: 114 mg/dL — ABNORMAL HIGH (ref 70–99)
Glucose-Capillary: 96 mg/dL (ref 70–99)
Glucose-Capillary: 95 mg/dL (ref 70–99)
Glucose-Capillary: 132 mg/dL — ABNORMAL HIGH (ref 70–99)

## 2020-08-12 LAB — TYPE AND SCREEN
ABO/RH(D): O POS
Antibody Screen: NEGATIVE

## 2020-08-12 MED ORDER — OXYCODONE-ACETAMINOPHEN 5-325 MG PO TABS
2.0000 | ORAL_TABLET | ORAL | Status: DC | PRN
  Administered 2020-08-12: 2 via ORAL
  Filled 2020-08-12: qty 2

## 2020-08-12 MED ORDER — OXYTOCIN-SODIUM CHLORIDE 30-0.9 UT/500ML-% IV SOLN
1.0000 m[IU]/min | INTRAVENOUS | Status: DC
Start: 2020-08-12 — End: 2020-08-13
  Administered 2020-08-13: 1 m[IU]/min via INTRAVENOUS
  Filled 2020-08-12: qty 500

## 2020-08-12 MED ORDER — OXYTOCIN-SODIUM CHLORIDE 30-0.9 UT/500ML-% IV SOLN: 2.5000 [IU]/h | INTRAVENOUS | Status: DC

## 2020-08-12 MED ORDER — MISOPROSTOL 25 MCG QUARTER TABLET
25.0000 ug | ORAL_TABLET | ORAL | Status: DC | PRN
  Administered 2020-08-12 (×4): 25 ug via VAGINAL
  Filled 2020-08-12 (×4): qty 1

## 2020-08-12 MED ORDER — ZOLPIDEM TARTRATE 5 MG PO TABS: 5.0000 mg | ORAL_TABLET | Freq: Once | ORAL | Status: DC

## 2020-08-12 MED ORDER — OXYTOCIN-SODIUM CHLORIDE 30-0.9 UT/500ML-% IV SOLN: 1.0000 m[IU]/min | INTRAVENOUS | Status: DC

## 2020-08-12 MED ORDER — ACETAMINOPHEN 325 MG PO TABS: 650.0000 mg | ORAL_TABLET | ORAL | Status: DC | PRN

## 2020-08-12 MED ORDER — LIDOCAINE HCL (PF) 1 % IJ SOLN: 30.0000 mL | INTRAMUSCULAR | Status: DC | PRN

## 2020-08-12 MED ORDER — ZOLPIDEM TARTRATE 5 MG PO TABS
5.0000 mg | ORAL_TABLET | Freq: Once | ORAL | Status: AC
  Administered 2020-08-12: 5 mg via ORAL
  Filled 2020-08-12: qty 1

## 2020-08-12 MED ORDER — OXYTOCIN BOLUS FROM INFUSION
333.0000 mL | Freq: Once | INTRAVENOUS | Status: AC
  Administered 2020-08-14: 333 mL via INTRAVENOUS

## 2020-08-12 MED ORDER — SOD CITRATE-CITRIC ACID 500-334 MG/5ML PO SOLN
30.0000 mL | ORAL | Status: DC | PRN
  Administered 2020-08-13: 30 mL via ORAL
  Filled 2020-08-12: qty 30

## 2020-08-12 MED ORDER — OXYCODONE-ACETAMINOPHEN 5-325 MG PO TABS
1.0000 | ORAL_TABLET | ORAL | Status: DC | PRN
  Administered 2020-08-12: 1 via ORAL
  Filled 2020-08-12: qty 1

## 2020-08-12 MED ORDER — LACTATED RINGERS IV SOLN
INTRAVENOUS | Status: DC
  Administered 2020-08-14: 125 mL/h via INTRAVENOUS

## 2020-08-12 MED ORDER — TERBUTALINE SULFATE 1 MG/ML IJ SOLN: 0.2500 mg | Freq: Once | INTRAMUSCULAR | Status: DC | PRN

## 2020-08-12 MED ORDER — ONDANSETRON HCL 4 MG/2ML IJ SOLN
4.0000 mg | Freq: Four times a day (QID) | INTRAMUSCULAR | Status: DC | PRN
  Administered 2020-08-13: 4 mg via INTRAVENOUS
  Filled 2020-08-12: qty 2

## 2020-08-12 MED ORDER — LACTATED RINGERS IV SOLN
500.0000 mL | INTRAVENOUS | Status: DC | PRN
  Administered 2020-08-12: 1000 mL via INTRAVENOUS

## 2020-08-12 MED ORDER — FENTANYL CITRATE (PF) 100 MCG/2ML IJ SOLN: 50.0000 ug | INTRAMUSCULAR | Status: DC | PRN

## 2020-08-12 NOTE — Progress Notes (Signed)
OB Progress Note  S: Patient resting comfortably. Has slept throughout the night.  O: BP (!) 108/46   Pulse 75   Resp 17   Ht 5\' 1"  (1.549 m)   Wt 86.8 kg   BMI 36.15 kg/m   FHT: 130bpm, moderate variablity, + accels, - decels Toco: irregular, occasional SVE: deferred, recent RN exam 0.5/thick/ballotable at [phone]  A/P: 35 y.o. G1P0 @ [redacted]w[redacted]d admitted for A2DM.  FWB: Cat. I Labor course: S/p Cytotec x 2 doses, continue cervical ripening Pain: Per patient request GBS: Negative Anticipate SVD  [redacted]w[redacted]d, DO

## 2020-08-12 NOTE — Progress Notes (Signed)
FHT reviewed baseline 160 with moderate variability, + accel, - decels. Prolonged accelerations present. Contractions not graphing well but in some areas appear 1-2 minutes. Per primary RN, each contraction palpates strong. Cervix 3/50/-3 by primary RN after foley bulb expulsion.  Given frequency of her contractions, will not augment and assess for cervical change. If no cervical change or contractions space out, okay to start Pitocin 1x1.   Ambien 5mg  re-ordered to aid with patient sleep, per patient request. BS 132 after having had a meal with carbohydrates at dinner time. Other blood sugars have been normal. Will continue monitor and add SSI PRN.  , DO

## 2020-08-12 NOTE — Progress Notes (Addendum)
OB Progress Note  S: Patient bouncing on birthing ball. Reports feeling cramping and low back pain. Consents to foley bulb placement.  O: BP 126/64   Pulse 65   Temp 98.4 F (36.9 C) (Oral)   Resp 16   Ht 5\' 1"  (1.549 m)   Wt 86.8 kg   SpO2 100%   BMI 36.15 kg/m   FHT: 145bpm, moderate variablity, + accels, - decels Toco: irregular SVE: 1/50/-3, sutures palpated, cervix soft  A/P: 35 y.o. G1P0 @ [redacted]w[redacted]d admitted for A2DM.  FWB: Cat. I Labor course: S/p Cytotec x 4 doses, Foley bulb placed (30cc, patient unable to tolerate more) Pain: Per patient request GBS: Negative Anticipate SVD  Okay to eat small meal for dinner and then back to clear liquids.  [redacted]w[redacted]d, DO

## 2020-08-13 ENCOUNTER — Inpatient Hospital Stay (HOSPITAL_COMMUNITY): Payer: BC Managed Care – PPO | Admitting: Anesthesiology

## 2020-08-13 LAB — GLUCOSE, CAPILLARY
Glucose-Capillary: 98 mg/dL (ref 70–99)
Glucose-Capillary: 87 mg/dL (ref 70–99)
Glucose-Capillary: 92 mg/dL (ref 70–99)
Glucose-Capillary: 111 mg/dL — ABNORMAL HIGH (ref 70–99)
Glucose-Capillary: 64 mg/dL — ABNORMAL LOW (ref 70–99)
Glucose-Capillary: 72 mg/dL (ref 70–99)
Glucose-Capillary: 107 mg/dL — ABNORMAL HIGH (ref 70–99)

## 2020-08-13 MED ORDER — DIPHENHYDRAMINE HCL 50 MG/ML IJ SOLN
12.5000 mg | INTRAMUSCULAR | Status: AC | PRN
Start: 2020-08-13 — End: 2020-08-13
  Administered 2020-08-13 (×3): 12.5 mg via INTRAVENOUS
  Filled 2020-08-13: qty 1

## 2020-08-13 MED ORDER — LACTATED RINGERS IV SOLN: 500.0000 mL | Freq: Once | INTRAVENOUS | Status: DC

## 2020-08-13 MED ORDER — EPHEDRINE 5 MG/ML INJ: 10.0000 mg | INTRAVENOUS | Status: DC | PRN

## 2020-08-13 MED ORDER — LIDOCAINE HCL (PF) 1 % IJ SOLN
INTRAMUSCULAR | Status: DC | PRN
  Administered 2020-08-13: 5 mL via EPIDURAL
  Administered 2020-08-13: 2 mL via EPIDURAL
  Administered 2020-08-13: 3 mL via EPIDURAL

## 2020-08-13 MED ORDER — LACTATED RINGERS IV SOLN
500.0000 mL | Freq: Once | INTRAVENOUS | Status: AC
  Administered 2020-08-13: 500 mL via INTRAVENOUS

## 2020-08-13 MED ORDER — PHENYLEPHRINE 40 MCG/ML (10ML) SYRINGE FOR IV PUSH (FOR BLOOD PRESSURE SUPPORT): 80.0000 ug | PREFILLED_SYRINGE | INTRAVENOUS | Status: DC | PRN

## 2020-08-13 MED ORDER — PHENYLEPHRINE 40 MCG/ML (10ML) SYRINGE FOR IV PUSH (FOR BLOOD PRESSURE SUPPORT)
80.0000 ug | PREFILLED_SYRINGE | INTRAVENOUS | Status: DC | PRN
Start: 2020-08-13 — End: 2020-08-14
  Filled 2020-08-13: qty 10

## 2020-08-13 MED ORDER — FENTANYL-BUPIVACAINE-NACL 0.5-0.125-0.9 MG/250ML-% EP SOLN
12.0000 mL/h | EPIDURAL | Status: DC | PRN
  Administered 2020-08-13 – 2020-08-14 (×2): 12 mL/h via EPIDURAL
  Filled 2020-08-13 (×2): qty 250

## 2020-08-13 MED ORDER — OXYTOCIN-SODIUM CHLORIDE 30-0.9 UT/500ML-% IV SOLN
1.0000 m[IU]/min | INTRAVENOUS | Status: DC
  Administered 2020-08-13: 6 m[IU]/min via INTRAVENOUS
  Administered 2020-08-13: 32 m[IU]/min via INTRAVENOUS
  Administered 2020-08-14: 30 m[IU]/min via INTRAVENOUS
  Filled 2020-08-13: qty 500

## 2020-08-13 NOTE — Anesthesia Procedure Notes (Signed)
Epidural Patient location during procedure: OB Start time: 08/13/2020 8:25 AM End time: 08/13/2020 8:30 AM  Staffing Anesthesiologist: Marcene Duos, MD Performed: anesthesiologist   Preanesthetic Checklist Completed: patient identified, IV checked, site marked, risks and benefits discussed, surgical consent, monitors and equipment checked, pre-op evaluation and timeout performed  Epidural Patient position: sitting Prep: DuraPrep and site prepped and draped Patient monitoring: continuous pulse ox and blood pressure Approach: midline Location: L4-L5 Injection technique: LOR air  Needle:  Needle type: Tuohy  Needle gauge: 17 G Needle length: 9 cm and 9 Needle insertion depth: 5 cm cm Catheter type: closed end flexible Catheter size: 19 Gauge Catheter at skin depth: 11 (10-->11 cm when sat upright.) cm Test dose: negative  Assessment Events: blood not aspirated, injection not painful, no injection resistance, no paresthesia and negative IV test

## 2020-08-13 NOTE — Progress Notes (Signed)
OB Progress Note  S: Patient resting comfortably with epidural. Consents to IUPC placement due to difficulty graphing contractions.  O: BP (!) 95/54   Pulse 62   Temp 98.2 F (36.8 C) (Oral)   Resp 18   Ht 5\' 1"  (1.549 m)   Wt 86.8 kg   SpO2 99%   BMI 36.15 kg/m   FHT: 120bpm, moderate variablity, + accels, - decels, scalp stim present Toco: not graphing well SVE: 4-5/80/-2, sutures palpated, amniotic fluid still clear  A/P: 35 y.o. G1P0 @ [redacted]w[redacted]d admitted for induction of labor for A2DM.  FWB: Cat. I Labor course: S/p Cytotec x 4, s/p FB, Pitocin currently at 44mU/min - titrate as tolerated, IUPC placed Pain: Comfortable with epidural GBS: Negative Anticipate SVD  Will reasses for cervical change in ~3-4 hours.  12m, DO

## 2020-08-13 NOTE — Progress Notes (Signed)
OB Progress Note  S: Patient resting comfortably with epidural. Has been intermittently sleeping/resting.  O: BP 95/74   Pulse 60   Temp 97.6 F (36.4 C) (Axillary)   Resp 18   Ht 5\' 1"  (1.549 m)   Wt 86.8 kg   SpO2 99%   BMI 36.15 kg/m   FHT: 140bpm, moderate variablity, + accels, - decels, scalp stim present Toco: not graphing well SVE: 6-7/100/-2, sutures palpated  A/P: 35 y.o. G1P0 @ [redacted]w[redacted]d admitted for induction of labor for A2DM.  FWB: Cat. I Labor course: S/p Cytotec x 4, s/p FB, Pitocin currently at 63mU/min - continue titrating to max of 40 q30 minutes as tolerated, IUPC readjusted after cervical exam, MVUs appear ~100 Pain: Comfortable with epidural GBS: Negative Anticipate SVD  Will reasses for cervical change in ~3 hours.  39m, DO

## 2020-08-13 NOTE — Progress Notes (Signed)
OB Progress Note  S: Patient resting comfortably with epidural. Has been sleeping.  O: BP 106/63   Pulse 71   Temp 98 F (36.7 C) (Oral)   Resp 18   Ht 5\' 1"  (1.549 m)   Wt 86.8 kg   SpO2 99%   BMI 36.15 kg/m   FHT: 145bpm, moderate variablity, + accels, - decels, scalp stim present Toco: q1-3 minutes SVE: 7-8/100/-2  A/P: 35 y.o. G1P0 @ [redacted]w[redacted]d admitted for induction of labor for A2DM.  FWB: Cat. I Labor course: S/p Cytotec x 4, s/p FB, Pitocin currently at 3mU/min - continue until at least 1 hour, then will give pitocin break and restart at half 30 minutes later due to inadequacy in the strength of contractions and contractions not occurring regularly/consistently Pain: Comfortable with epidural GBS: Negative Anticipate SVD  Will reasses for cervical change in ~3-4 hours.  43m, DO

## 2020-08-13 NOTE — Progress Notes (Signed)
OB Progress Note  S: Patient resting comfortably. Just completed breakfast clears tray. Discussed AROM now; however, patient would prefer to wait to allow pitocin to continue to titrate upward and would like epidural prior to.  O: BP 124/80   Pulse 81   Temp 97.6 F (36.4 C) (Oral)   Resp 18   Ht 5\' 1"  (1.549 m)   Wt 86.8 kg   SpO2 100%   BMI 36.15 kg/m   FHT: 140bpm, moderate variablity, + accels, - decels Toco: irregular SVE: 3/60/-3 by primary RN just prior to my arrival to room  A/P: 35 y.o. G1P0 @ [redacted]w[redacted]d admitted for induction of labor for A2DM.  FWB: Cat. I Labor course: S/p Cytotec x 4, s/p FB, Pitocin currently at 40mU/min, will begin increasing 2x2 Pain: Desires epidural prior to AROM GBS: Negative Anticipate SVD  4m, DO

## 2020-08-13 NOTE — Progress Notes (Signed)
Hypoglycemic Event  CBG: 64   Treatment: 8 oz juice/soda  Symptoms: None  Follow-up CBG: Time:1904  CBG Result: 72  Possible Reasons for Event: Other: Laboring  Comments/MD notified: Connye Burkitt, MD    Yisroel Ramming

## 2020-08-13 NOTE — Progress Notes (Signed)
OB Progress Note  S: Patient resting comfortably with epidural. Doula present at bedside along with husband. Consents to AROM.  O: BP 109/66   Pulse 69   Temp 97.6 F (36.4 C) (Oral)   Resp 18   Ht 5\' 1"  (1.549 m)   Wt 86.8 kg   SpO2 99%   BMI 36.15 kg/m   FHT: 140bpm, moderate variablity, + accels, - decels Toco: not graphing well SVE: 3-4/50/-3, sutures palpated AROM: Clear, non-odorous  A/P: 35 y.o. G1P0 @ [redacted]w[redacted]d admitted for induction of labor for A2DM.  FWB: Cat. I Labor course: S/p Cytotec x 4, s/p FB, Pitocin currently at 57mU/min - titrate as tolerated Pain: Comfortable with epidural GBS: Negative Anticipate SVD  Will reasses for cervical change in ~3 hours. Will consider IUPC if still having difficulty graphing contractions.  18m, DO

## 2020-08-13 NOTE — Anesthesia Preprocedure Evaluation (Signed)
Anesthesia Evaluation  Patient identified by MRN, date of birth, ID band Patient awake    Reviewed: Allergy & Precautions, Patient's Chart, lab work & pertinent test results  Airway Mallampati: II       Dental   Pulmonary neg pulmonary ROS,    Pulmonary exam normal        Cardiovascular negative cardio ROS Normal cardiovascular exam     Neuro/Psych negative neurological ROS     GI/Hepatic negative GI ROS, Neg liver ROS,   Endo/Other  diabetes, Gestational  Renal/GU negative Renal ROS     Musculoskeletal   Abdominal   Peds  Hematology  (+) anemia ,   Anesthesia Other Findings   Reproductive/Obstetrics (+) Pregnancy                             Lab Results  Component Value Date   WBC 8.4 08/12/2020   HGB 10.7 (L) 08/12/2020   HCT 32.3 (L) 08/12/2020   MCV 90.7 08/12/2020   PLT 238 08/12/2020    Anesthesia Physical Anesthesia Plan  ASA: 2  Anesthesia Plan: Epidural   Post-op Pain Management:    Induction:   PONV Risk Score and Plan: Treatment may vary due to age or medical condition  Airway Management Planned: Natural Airway  Additional Equipment:   Intra-op Plan:   Post-operative Plan:   Informed Consent: I have reviewed the patients History and Physical, chart, labs and discussed the procedure including the risks, benefits and alternatives for the proposed anesthesia with the patient or authorized representative who has indicated his/her understanding and acceptance.       Plan Discussed with:   Anesthesia Plan Comments:         Anesthesia Quick Evaluation

## 2020-08-14 ENCOUNTER — Encounter (HOSPITAL_COMMUNITY): Payer: Self-pay | Admitting: Obstetrics and Gynecology

## 2020-08-14 LAB — GLUCOSE, CAPILLARY
Glucose-Capillary: 103 mg/dL — ABNORMAL HIGH (ref 70–99)
Glucose-Capillary: 99 mg/dL (ref 70–99)
Glucose-Capillary: 100 mg/dL — ABNORMAL HIGH (ref 70–99)

## 2020-08-14 MED ORDER — SIMETHICONE 80 MG PO CHEW
80.0000 mg | CHEWABLE_TABLET | ORAL | Status: DC | PRN
  Administered 2020-08-16: 80 mg via ORAL
  Filled 2020-08-14: qty 1

## 2020-08-14 MED ORDER — ONDANSETRON HCL 4 MG PO TABS
4.0000 mg | ORAL_TABLET | ORAL | Status: DC | PRN
  Administered 2020-08-15 – 2020-08-16 (×2): 4 mg via ORAL
  Filled 2020-08-14 (×2): qty 1

## 2020-08-14 MED ORDER — TRANEXAMIC ACID-NACL 1000-0.7 MG/100ML-% IV SOLN
1000.0000 mg | INTRAVENOUS | Status: AC
  Administered 2020-08-14: 1000 mg via INTRAVENOUS

## 2020-08-14 MED ORDER — MISOPROSTOL 200 MCG PO TABS
ORAL_TABLET | ORAL | Status: AC
  Filled 2020-08-14: qty 5

## 2020-08-14 MED ORDER — SENNOSIDES-DOCUSATE SODIUM 8.6-50 MG PO TABS
2.0000 | ORAL_TABLET | Freq: Every day | ORAL | Status: DC
  Administered 2020-08-15 – 2020-08-16 (×2): 2 via ORAL
  Filled 2020-08-14 (×2): qty 2

## 2020-08-14 MED ORDER — PRENATAL MULTIVITAMIN CH
1.0000 | ORAL_TABLET | Freq: Every day | ORAL | Status: DC
  Administered 2020-08-15 – 2020-08-16 (×2): 1 via ORAL
  Filled 2020-08-14 (×2): qty 1

## 2020-08-14 MED ORDER — DIBUCAINE (PERIANAL) 1 % EX OINT: 1.0000 "application " | TOPICAL_OINTMENT | CUTANEOUS | Status: DC | PRN

## 2020-08-14 MED ORDER — COCONUT OIL OIL
1.0000 "application " | TOPICAL_OIL | Status: DC | PRN
  Administered 2020-08-15: 1 via TOPICAL

## 2020-08-14 MED ORDER — ZOLPIDEM TARTRATE 5 MG PO TABS: 5.0000 mg | ORAL_TABLET | Freq: Every evening | ORAL | Status: DC | PRN

## 2020-08-14 MED ORDER — MISOPROSTOL 200 MCG PO TABS
1000.0000 ug | ORAL_TABLET | Freq: Once | ORAL | Status: AC
  Administered 2020-08-14: 1000 ug via RECTAL

## 2020-08-14 MED ORDER — ONDANSETRON HCL 4 MG/2ML IJ SOLN: 4.0000 mg | INTRAMUSCULAR | Status: DC | PRN

## 2020-08-14 MED ORDER — WITCH HAZEL-GLYCERIN EX PADS: 1.0000 "application " | MEDICATED_PAD | CUTANEOUS | Status: DC | PRN

## 2020-08-14 MED ORDER — TRANEXAMIC ACID-NACL 1000-0.7 MG/100ML-% IV SOLN
INTRAVENOUS | Status: AC
  Filled 2020-08-14: qty 100

## 2020-08-14 MED ORDER — DIPHENHYDRAMINE HCL 25 MG PO CAPS
25.0000 mg | ORAL_CAPSULE | Freq: Four times a day (QID) | ORAL | Status: DC | PRN
  Administered 2020-08-16: 25 mg via ORAL
  Filled 2020-08-14: qty 1

## 2020-08-14 MED ORDER — DIPHENHYDRAMINE HCL 50 MG/ML IJ SOLN
INTRAMUSCULAR | Status: AC
  Filled 2020-08-14: qty 1

## 2020-08-14 MED ORDER — BENZOCAINE-MENTHOL 20-0.5 % EX AERO
1.0000 "application " | INHALATION_SPRAY | CUTANEOUS | Status: DC | PRN
  Administered 2020-08-14: 1 via TOPICAL
  Filled 2020-08-14: qty 56

## 2020-08-14 MED ORDER — ACETAMINOPHEN 325 MG PO TABS
650.0000 mg | ORAL_TABLET | ORAL | Status: DC | PRN
  Administered 2020-08-14 – 2020-08-16 (×6): 650 mg via ORAL
  Filled 2020-08-14 (×7): qty 2

## 2020-08-14 MED ORDER — FLUOXETINE HCL 20 MG PO CAPS
20.0000 mg | ORAL_CAPSULE | Freq: Every day | ORAL | Status: DC
  Administered 2020-08-14 – 2020-08-15 (×2): 20 mg via ORAL
  Filled 2020-08-14 (×2): qty 1

## 2020-08-14 MED ORDER — DIPHENHYDRAMINE HCL 50 MG/ML IJ SOLN
12.5000 mg | INTRAMUSCULAR | Status: DC | PRN
Start: 2020-08-14 — End: 2020-08-14
  Administered 2020-08-14: 12.5 mg via INTRAVENOUS

## 2020-08-14 MED ORDER — IBUPROFEN 600 MG PO TABS
600.0000 mg | ORAL_TABLET | Freq: Four times a day (QID) | ORAL | Status: DC
  Administered 2020-08-14 – 2020-08-16 (×8): 600 mg via ORAL
  Filled 2020-08-14 (×8): qty 1

## 2020-08-14 NOTE — Lactation Note (Signed)
Lactation Consultation Note  Patient Name: Marie Morris Date: 08/14/2020 Reason for consult: L&D Initial assessment;Term;1st time breastfeeding;Primapara Age:35 y.o.   Initial L&D Consult:  Visited with family > 1 hour after birth; baby asleep in mother's arms when I arrived.  Mother reported that he was able to latch for just a couple of minutes and fell asleep.  Reassured mother that lactation services will be available on the M/B unit.  Allowed time for family bonding.  Father present and both parents having lunch.   Maternal Data    Feeding Mother's Current Feeding Choice: Breast Milk  LATCH Score                    Lactation Tools Discussed/Used    Interventions    Discharge    Consult Status Consult Status: Follow-up Date: 08/14/20 Follow-up type: In-patient    Dora Sims 08/14/2020, 12:43 PM

## 2020-08-14 NOTE — Progress Notes (Signed)
OB Progress Note  S: Patient resting comfortably. Has been able to sleep.  O: BP 108/71   Pulse 68   Temp 99.9 F (37.7 C) (Oral)   Resp 18   Ht 5\' 1"  (1.549 m)   Wt 86.8 kg   SpO2 99%   BMI 36.15 kg/m   FHT: 145bpm, moderate variablity, + accels, - decels Toco: q4 minutes SVE: 7-8/100/-1  A/P: 35 y.o. G1P0 @ [redacted]w[redacted]d admitted for induction of labor for A2DM.  FWB: Cat. I Labor course: S/p Cytotec x 4, s/p FB, Pitocin currently at 86mU/min after 30 minute pitocin break - titrate back up to max of 69mU/min Pain: Comfortable with epidural GBS: Negative Anticipate SVD  Recommend throne position to aid with fetal descent and further dilation and continue titration of Pitocin back to maximum of 49mU/min. MVUs only currently at 50 (inadequate). Will reassess in several hours  43m, DO

## 2020-08-14 NOTE — Anesthesia Postprocedure Evaluation (Signed)
Anesthesia Post Note  Patient: Marie Morris  Procedure(s) Performed: AN AD HOC LABOR EPIDURAL     Patient location during evaluation: Mother Baby Anesthesia Type: Epidural Level of consciousness: awake and alert Pain management: pain level controlled Vital Signs Assessment: post-procedure vital signs reviewed and stable Respiratory status: spontaneous breathing, nonlabored ventilation and respiratory function stable Cardiovascular status: stable Postop Assessment: no headache, no backache and epidural receding Anesthetic complications: no   No notable events documented.  Last Vitals:  Vitals:   08/14/20 1505 08/14/20 1558  BP: 118/71 122/76  Pulse: 84 89  Resp: 16 18  Temp:  37.3 C  SpO2: 99% 99%    Last Pain:  Vitals:   08/14/20 1705  TempSrc:   PainSc: 4    Pain Goal: Patients Stated Pain Goal: 4 (08/12/20 1841)                 Rica Records

## 2020-08-14 NOTE — Lactation Note (Signed)
This note was copied from a baby's chart. Lactation Consultation Note  Patient Name: Marie Morris Today's Date: 08/14/2020 Reason for consult: Initial assessment;Primapara;1st time breastfeeding;Term;Maternal endocrine disorder Age:33 hours  Mom holding sleeping baby skin to skin, dad standing at bedside. Mom reports baby latched in L/D but has not since. Reports attempted a feeding ~75min prior to [facility] entering room. glucose reading #1= 57, #2=40.  LC taught HE, dad obtained drops, LC finger fed ~83ml to baby. Baby without cues uninterested in feeding at this time. Baby placed back skin to skin on mom's chest.  Plan - feed on cue 8-12 times a day - wake if >3hrs since last feeding - if no latch HE offer back to baby and call for assistance - continuous skin to skin while awake and alert - if supplementation needed mom desires DBM - setup DEBP if supplementation needed or if mom requests  Parents voiced understanding and with no further concerns. Left the room with mom holding sleeping baby skin to skin. BGilliam, RN, IBCLC  Maternal Data Has patient been taught Hand Expression?: Yes Does the patient have breastfeeding experience prior to this delivery?: No  Feeding Mother's Current Feeding Choice: Breast Milk    Interventions Interventions: Breast feeding basics reviewed;Skin to skin;Expressed milk;Hand pump  Discharge WIC Program: No  Consult Status Consult Status: Follow-up Date: 08/15/20 Follow-up type: In-patient    Charlynn Court 08/14/2020, 8:21 PM

## 2020-08-14 NOTE — Progress Notes (Signed)
OB Progress Note  S: Patient resting comfortably.  O: BP 110/71   Pulse 74   Temp 99.4 F (37.4 C) (Axillary)   Resp 16   Ht _0  (1.549 m)   Wt 86.8 kg   SpO2 99%   BMI 36.15 kg/m   FHT: 145bpm, moderate variablity, + accels, one late decel Toco: q3-4 min SVE: 7-8/100/-1, caput to 0 station  A/P: 35 y.o. G1P0 @ 18w6dadmitted for induction of labor for A2DM.  FWB: Cat. II Labor course: S/p Cytotec x 3, s/p FB, Pitocin back up to 328mmin Pain: Comfortable with epidural GBS: Negative Anticipate SVD  Cervix first became 7.5/100/-1 at 2139 on 8/13. Membranes ruptured since approximately 1000 on 8/13. Contractions have not been adequate throughout labor course and even when previously on Pitocin 4065min, contractions only occurred every 2-3 minutes. I counseled patient that she has met criteria for failed induction of labor. I discussed that it would be reasonable to proceed with cesarean section at this time; however, if patient does not desire this, given her reassuring vital signs and fetal status, okay to wait a little longer to reach maximum of pitocin.  Per primary RN, patient has decided she would like to wait to assess for cervical change until 10-1028. Will reassess at that time. If no cervical change by then, recommend cesarean delivery.  MelDrema DallasO

## 2020-08-14 NOTE — Lactation Note (Signed)
This note was copied from a baby's chart. Lactation Consultation Note  Patient Name: Marie Morris BZXYD'[id number] Date: 08/14/2020 Reason for consult: L&D Initial assessment;Primapara;1st time breastfeeding;Term Age:33 hours  Maternal Data    Feeding Mother's Current Feeding Choice: Breast Milk  LATCH Score Latch: Too sleepy or reluctant, no latch achieved, no sucking elicited.  Audible Swallowing: None  Type of Nipple: Everted at rest and after stimulation  Comfort (Breast/Nipple): Soft / non-tender  Hold (Positioning): Full assist, staff holds infant at breast  LATCH Score: 4   Lactation Tools Discussed/Used    Interventions Interventions: Breast feeding basics reviewed;Assisted with latch;Skin to skin  Discharge    Consult Status Consult Status: Follow-up Date: 08/14/20 Follow-up type: In-patient    Marie Morris 08/14/2020, 12:46 PM

## 2020-08-15 LAB — CBC
HCT: 25.2 % — ABNORMAL LOW (ref 36.0–46.0)
Hemoglobin: 8.5 g/dL — ABNORMAL LOW (ref 12.0–15.0)
MCH: 30.4 pg (ref 26.0–34.0)
MCHC: 33.7 g/dL (ref 30.0–36.0)
MCV: 90 fL (ref 80.0–100.0)
Platelets: 148 10*3/uL — ABNORMAL LOW (ref 150–400)
RBC: 2.8 MIL/uL — ABNORMAL LOW (ref 3.87–5.11)
RDW: 14.4 % (ref 11.5–15.5)
WBC: 11.5 10*3/uL — ABNORMAL HIGH (ref 4.0–10.5)
nRBC: 0 % (ref 0.0–0.2)

## 2020-08-15 LAB — GLUCOSE, CAPILLARY: Glucose-Capillary: 80 mg/dL (ref 70–99)

## 2020-08-15 MED ORDER — PNEUMOCOCCAL VAC POLYVALENT 25 MCG/0.5ML IJ INJ
0.5000 mL | INJECTION | INTRAMUSCULAR | Status: AC
  Administered 2020-08-16: 0.5 mL via INTRAMUSCULAR
  Filled 2020-08-15 (×2): qty 0.5

## 2020-08-15 NOTE — Progress Notes (Signed)
Post Partum Day 1 Subjective: no complaints, up ad lib, voiding, tolerating PO, and + flatus  Objective: Blood pressure 126/81, pulse 66, temperature 97.7 F (36.5 C), temperature source Oral, resp. rate 16, height 5\' 1"  (1.549 m), weight 86.8 kg, SpO2 100 %, unknown if currently breastfeeding.  Physical Exam:  General: alert, cooperative, and no distress Lochia: appropriate Uterine Fundus: firm Incision: NA DVT Evaluation: No evidence of DVT seen on physical exam.  Recent Labs    08/15/20 0612  HGB 8.5*  HCT 25.2*    Assessment/Plan: Plan for discharge tomorrow Breastfeeding- lactation consult as needed Desires Circumcision of newborn prior to discharge  Routine Postpartum Care    LOS: 3 days   08/17/20 08/15/2020, 8:42 AM

## 2020-08-15 NOTE — Lactation Note (Signed)
This note was copied from a baby's chart. Lactation Consultation Note  Patient Name: Marie Morris [id number]'B Date: 08/15/2020 Reason for consult: Follow-up assessment;Mother's request;Difficult latch;Primapara;1st time breastfeeding;Term Age:33 hours  Called to room to assist with latch. Mom states unable to latch however dad HE and finger fed ~70ml of colostrum to baby.  LC HE ~79ml colostrum via curved tip syringe, baby with strong cues latched with 3-4 strong tugs but releases breast. Baby kept this behavior despite position changes and alternating breasts. Encouraged mom to continue to allow baby to lick and learn and latch when cues are shown. Encouraged continuous skin to skin while awake and alert.  Plan - feed on cue 8-12 times a day - wake if >3hrs since last feeding - HE and offer colostrum back to baby - continuous skin to skin while awake and alert - call for support if needed prior to next [facility] visit  Parents voiced understanding and with no further concerns. Left the room with baby licking and learning on right breast cradle hold. BGilliam, RN, IBCLC    Feeding Mother's Current Feeding Choice: Breast Milk  LATCH Score Latch: Repeated attempts needed to sustain latch, nipple held in mouth throughout feeding, stimulation needed to elicit sucking reflex.  Audible Swallowing: None  Type of Nipple: Everted at rest and after stimulation  Comfort (Breast/Nipple): Soft / non-tender  Hold (Positioning): Full assist, staff holds infant at breast  LATCH Score: 5    Interventions Interventions: Assisted with latch;Skin to skin   Consult Status Consult Status: Follow-up Date: 08/15/20 Follow-up type: In-patient    Marie Morris 08/15/2020, 12:28 AM

## 2020-08-15 NOTE — Lactation Note (Addendum)
This note was copied from a baby's chart. Lactation Consultation Note  Patient Name: Marie Morris KDTOI'Z Date: 08/15/2020 Reason for consult: Follow-up assessment;Mother's request;Difficult latch;1st time breastfeeding;Term Age:33 hours, -4% weight loss, on billi lights Per mom, latches has been painful and she has blisters on both breast when using 20 mm NS. LC ask mom pre-pump with hand pump prior to latching infant, infant latched and sustained latch without NS for 10 minutes.  LC repeatedly flange infant's top lip outward, has tendency suck on top lip, LC notice infant has tight upper lingual frenulum.  Infant was supplemented at the breast with donor breast milk using a curve tip syringe,after 10 minutes mom used the 24 mm NS and infant breastfeed total of 20 minutes sustaining latch. Infant was given total 10 mls of donor breast milk at the breast with curve tip syringe. Infant appeared content afterwards and was placed back under billi lights while mom used the DEBP. Mom plans to put on bra and use comfort gels after she finished pumping. Per mom, she did not feel any pain with this latch without and with NS. Mom's plan: 1- Continue to pre-pump breast prior to latching infant, work on flanging infant's top lip outward and attempt to latch infant at first without 24 mm NS. 2-Mom will breastfeed infant according to cues, 8 to 12+ or more times within 24 hours, skin to skin. 3- Mom will continue to supplement infant with donor breast milk Day 2 with ( 7 -12 mls ) per feeding. 4- Mom will continue to use DEBP every 3 hours for 15 minutes on initial setting.  5- Mom knows to call RN or LC if she need further assistance with latching infant at the breast.  Maternal Data    Feeding Mother's Current Feeding Choice: Breast Milk and Donor Milk  LATCH Score Latch: Grasps breast easily, tongue down, lips flanged, rhythmical sucking.  Audible Swallowing: Spontaneous and  intermittent  Type of Nipple: Everted at rest and after stimulation (Mom's nipples are short shafted, Infant has tight upper lingual frenulum.)  Comfort (Breast/Nipple): Soft / non-tender  Hold (Positioning): Assistance needed to correctly position infant at breast and maintain latch.  LATCH Score: 9   Lactation Tools Discussed/Used Tools: Pump Nipple shield size: 24 (Mom had blister with 20 mm NS, felt it was painful, feels 24 mm fits better.) Flange Size: 21 Breast pump type: Double-Electric Breast Pump;Manual  Interventions Interventions: Breast massage;Pre-pump if needed;Support pillows;Adjust position;Breast compression;Assisted with latch;Skin to skin;DEBP;Comfort gels  Discharge Pump: Manual;DEBP  Consult Status Consult Status: Follow-up Date: 08/16/20 Follow-up type: In-patient    Danelle Earthly 08/15/2020, 10:47 PM

## 2020-08-15 NOTE — Social Work (Signed)
MOB was referred for history of anxiety.  * Referral screened out by Clinical Social Worker because none of the following criteria appear to apply:  ~ History of anxiety during this pregnancy, or of post-partum depression following prior delivery.  ~ Diagnosis of anxiety and/or depression within last 3 years OR * MOB's symptoms currently being treated with medication and/or therapy. Per chart review, MOB takes Fluoxetine 20 mg daily for anxiety, mood stable.   Please contact the Clinical Social Worker if needs arise, by [facility] request, or if MOB scores greater than 9/yes to question 10 on Edinburgh Postpartum Depression Screen.   Vivi Barrack, MSW, LCSW [facility]  Clinical Social Worker  [phone] 08/15/2020  9:06 AM

## 2020-08-15 NOTE — Lactation Note (Signed)
This note was copied from a baby's chart. Lactation Consultation Note  Patient Name: Marie Morris ZSMOL'M Date: 08/15/2020 Reason for consult: Follow-up assessment;Mother's request;Difficult latch;Primapara;1st time breastfeeding;Term;Maternal endocrine disorder Age:33 hours  LC not able to see a latch, infant fed 2 hrs prior and resting comfortably.  Mom sore nipples with redness and compression stripe. Mom to use coconut oil and comfort gels for nipple care. Mom to pre pump with manual before latching.   Mom to call for latch assistance for next feeding.   Maternal Data Has patient been taught Hand Expression?: Yes  Feeding Mother's Current Feeding Choice: Breast Milk  LATCH Score    Lactation Tools Discussed/Used Tools: Pump;Flanges;Comfort gels Flange Size: 21 Breast pump type: Manual Pump Education: Setup, frequency, and cleaning;Milk Storage Reason for Pumping: elongate nipples Pumping frequency: pre pump for 5-10 min before latching  Interventions Interventions: Breast feeding basics reviewed;Breast compression;Hand pump;Skin to skin;Support pillows;Breast massage;Position options;Hand express;Expressed milk;Education;Coconut oil  Discharge Pump: Personal  Consult Status Consult Status: Follow-up Date: 08/16/20 Follow-up type: In-patient    Rotha Cassels  [name] 08/15/2020, 11:31 AM

## 2020-08-15 NOTE — Lactation Note (Signed)
This note was copied from a baby's chart. Lactation Consultation Note  Patient Name: Marie Morris HWEXH'B Date: 08/15/2020 Reason for consult: Follow-up assessment;Mother's request;Difficult latch;Primapara;1st time breastfeeding;Maternal endocrine disorder Age:33 hours  LC returned to assist with latching with help of 20 NS.   Plan 1. To feed based on cues 8-12x in 24 hr period 2. Mom to latch at breast first and then if unable to sustain use the 20 NS 3. Mom to pump with DEBP q 3 hrs for 15 min   All questions answered at the end of the visit.   Maternal Data Has patient been taught Hand Expression?: Yes  Feeding Mother's Current Feeding Choice: Breast Milk  LATCH Score Latch: Repeated attempts needed to sustain latch, nipple held in mouth throughout feeding, stimulation needed to elicit sucking reflex.  Audible Swallowing: Spontaneous and intermittent  Type of Nipple: Everted at rest and after stimulation (short shafted, small nipple hard for infant to sustain without help of 20 NS)  Comfort (Breast/Nipple): Soft / non-tender  Hold (Positioning): Assistance needed to correctly position infant at breast and maintain latch.  LATCH Score: 8   Lactation Tools Discussed/Used Tools: Pump;Flanges;Nipple Shields Nipple shield size: 20 Flange Size: 21 Breast pump type: Double-Electric Breast Pump Pump Education: Setup, frequency, and cleaning;Milk Storage Reason for Pumping: increase stimulation Pumping frequency: every 3 hrs for 15 min  Interventions Interventions: Breast feeding basics reviewed;Breast compression;Assisted with latch;Adjust position;Skin to skin;Support pillows;DEBP;Breast massage;Position options;Hand express;Expressed milk;Education;Comfort gels;Coconut oil  Discharge Pump: Personal WIC Program: No  Consult Status Consult Status: Follow-up Date: 08/16/20 Follow-up type: In-patient    Marie Fedora  Morris 08/15/2020, 12:31 PM

## 2020-08-16 LAB — SURGICAL PATHOLOGY

## 2020-08-16 MED ORDER — IBUPROFEN 600 MG PO TABS
600.0000 mg | ORAL_TABLET | Freq: Four times a day (QID) | ORAL | 0 refills | Status: DC
Start: 2020-08-16 — End: 2020-11-18

## 2020-08-16 NOTE — Discharge Summary (Signed)
Postpartum Discharge Summary  Date of Service: 08/16/20     Patient Name: Marie Morris DOB: 1985-10-07 MRN: [id number]  Date of admission: 08/12/2020 Delivery date:08/14/2020  Delivering provider: Drema Dallas  Date of discharge: 08/16/2020  Admitting diagnosis: Gestational diabetes [O24.419] Intrauterine pregnancy: [redacted]w[redacted]d    Secondary diagnosis:  Active Problems:   Gestational diabetes mellitus (GDM), antepartum   Obesity (BMI 30.0-34.9)   Anxiety   Gestational diabetes  Additional problems: None    Discharge diagnosis: Term Pregnancy Delivered                                              Post partum procedures: None Augmentation: AROM, Pitocin, Cytotec, and IP Foley Complications: None  Hospital course: Induction of Labor With Vaginal Delivery   35y.o. yo G1P1001 at 38w6das admitted to the hospital 08/12/2020 for induction of labor.  Indication for induction: A2 DM.  Patient had an uncomplicated labor course as follows: Membrane Rupture Time/Date: 10:06 AM ,08/13/2020   Delivery Method:Vaginal, Spontaneous  Episiotomy: None  Lacerations:  2nd degree;Perineal  Details of delivery can be found in separate delivery note.  Patient had a routine postpartum course. She was noted to have a few elevated blood pressures during labor but overall normotensive. Patient is discharged home 08/16/20.  Newborn Data: Birth date:08/14/2020  Birth time:11:08 AM  Gender:Female  Living status:Living  Apgars:9 ,9  Weight:3605 g   Magnesium Sulfate received: No BMZ received: No Rhophylac:N/A MMR:N/A T-DaP:Given prenatally Flu: Yes - received 11/2019 Transfusion:No  Physical exam  Vitals:   08/15/20 0058 08/15/20 0521 08/15/20 2015 08/16/20 0510  BP: 98/64 126/81 125/84 118/75  Pulse: 73 66 81 63  Resp: 18 16 17 16   Temp: 97.7 F (36.5 C)  97.9 F (36.6 C) 97.7 F (36.5 C)  TempSrc: Oral  Oral Oral  SpO2: 99% 100% 100% 100%  Weight:      Height:       General: alert,  cooperative, and no distress Cardio:  RRR Lungs:  CTAB, no wheezes/rales/rhonchi Lochia: appropriate Uterine Fundus: firm Incision: N/A DVT Evaluation: No evidence of DVT seen on physical exam. No cords or calf tenderness. Labs: Lab Results  Component Value Date   WBC 11.5 (H) 08/15/2020   HGB 8.5 (L) 08/15/2020   HCT 25.2 (L) 08/15/2020   MCV 90.0 08/15/2020   PLT 148 (L) 08/15/2020   CMP 06/13/2007  Glucose 99  BUN 8  Creatinine 0.7  Sodium 139  Potassium 3.8  Chloride 106   Edinburgh Score: Edinburgh Postnatal Depression Scale Screening Tool 08/15/2020  I have been able to laugh and see the funny side of things. 0  I have looked forward with enjoyment to things. 0  I have blamed myself unnecessarily when things went wrong. 1  I have been anxious or worried for no good reason. 1  I have felt scared or panicky for no good reason. 1  Things have been getting on top of me. 0  I have been so unhappy that I have had difficulty sleeping. 0  I have felt sad or miserable. 1  I have been so unhappy that I have been crying. 0  The thought of harming myself has occurred to me. 0  Edinburgh Postnatal Depression Scale Total 4      After visit meds:  Allergies as of 08/16/2020  Reactions   Almond (diagnostic)    Apple    Cherry    Hazelnut (filbert) Allergy Skin Test    Peanut (diagnostic)    Plum Pulp         Medication List     STOP taking these medications    EPINEPHrine 0.3 mg/0.3 mL Soaj injection Commonly known as: EPI-PEN   metFORMIN 1000 MG tablet Commonly known as: GLUCOPHAGE   metFORMIN 500 MG tablet Commonly known as: GLUCOPHAGE       TAKE these medications    acetaminophen 500 MG tablet Commonly known as: TYLENOL Take 1,000 mg by mouth every 6 (six) hours as needed for mild pain, fever or headache.   calcium carbonate 500 MG chewable tablet Commonly known as: TUMS - dosed in mg elemental calcium Chew 500-1,000 mg by mouth 3 (three)  times daily as needed for indigestion or heartburn.   cetirizine 10 MG tablet Commonly known as: ZYRTEC Take 10 mg by mouth daily.   famotidine 20 MG tablet Commonly known as: PEPCID Take 20 mg by mouth 2 (two) times daily as needed for heartburn or indigestion.   FLUoxetine 20 MG capsule Commonly known as: PROZAC Take 20 mg by mouth daily.   ibuprofen 600 MG tablet Commonly known as: ADVIL Take 1 tablet (600 mg total) by mouth every 6 (six) hours.   MELATIN PO Take 3 mg by mouth at bedtime as needed (sleep).   PRENATAL MULTI +DHA PO Take 1 tablet by mouth daily.         Discharge home in stable condition Infant Feeding: Breast Infant Disposition:home with mother Discharge instruction: per After Visit Summary and Postpartum booklet. Activity: Advance as tolerated. Pelvic rest for 6 weeks.  Diet: carb modified diet Anticipated Birth Control: Unsure Postpartum Appointment:6 weeks Additional Postpartum F/U: Postpartum Depression checkup and BP check 1 week Future Appointments:No future appointments. Follow up Visit:  Follow-up Information     Drema Dallas, DO Follow up in 1 week(s).   Specialty: Obstetrics and Gynecology Why: We will arrange your post-partum follow-up visits. Contact information: [address] [place] [address] [phone]                     08/16/2020 Drema Dallas, DO

## 2020-08-16 NOTE — Lactation Note (Signed)
This note was copied from a baby's chart. Lactation Consultation Note  Patient Name: Marie Morris [id number] Date: 08/16/2020 Reason for consult: Follow-up assessment Age:33 hours  P1, Baby recently received 25 ml donor milk with curved tip syringe.  Discussed using slow flow nipple at home. Observed short feeding with #24 nipple shield. Reviewed application and feeding plan. Mother will post pump using 21 or 24 flanges whichever is more comfortable.  Observed pumping with both sizes. Plan is to breastfeed, pump after and give volume back to baby at next feeding. Reviewed engorgement care and monitoring voids/stools.   Feeding Mother's Current Feeding Choice: Breast Milk and Donor Milk  LATCH Score Latch: Grasps breast easily, tongue down, lips flanged, rhythmical sucking.  Audible Swallowing: A few with stimulation  Type of Nipple: Everted at rest and after stimulation  Comfort (Breast/Nipple): Filling, red/small blisters or bruises, mild/mod discomfort  Hold (Positioning): Assistance needed to correctly position infant at breast and maintain latch.  LATCH Score: 7   Lactation Tools Discussed/Used Tools: Pump;Nipple Shields Nipple shield size: 24 Flange Size: 21;24 Breast pump type: Double-Electric Breast Pump Pump Education: Milk Storage Reason for Pumping: stimulation and supplementation Pumping frequency: q 3 hours Pumped volume: 5 mL  Interventions Interventions: Breast feeding basics reviewed;Assisted with latch;Hand express;Education;DEBP;Comfort gels;Coconut oil  Discharge Discharge Education: Engorgement and breast care;Warning signs for feeding baby Pump: Personal;DEBP (Hands free)  Consult Status Consult Status: Follow-up Date: 08/17/20 Follow-up type: In-patient    Dahlia Byes [facility] 08/16/2020, 3:05 PM

## 2020-08-19 ENCOUNTER — Inpatient Hospital Stay (HOSPITAL_COMMUNITY)
Admission: AD | Admit: 2020-08-19 | Discharge: 2020-08-19 | Disposition: A | Discharge status: Home / Self Care | Payer: BC Managed Care – PPO | Attending: Obstetrics and Gynecology | Admitting: Obstetrics and Gynecology

## 2020-08-19 ENCOUNTER — Encounter (HOSPITAL_COMMUNITY): Payer: Self-pay | Admitting: Obstetrics and Gynecology

## 2020-08-19 ENCOUNTER — Other Ambulatory Visit: Payer: Self-pay

## 2020-08-19 DIAGNOSIS — D649 Anemia, unspecified: Secondary | ICD-10-CM

## 2020-08-19 DIAGNOSIS — O9089 Other complications of the puerperium, not elsewhere classified: Secondary | ICD-10-CM | POA: Insufficient documentation

## 2020-08-19 DIAGNOSIS — Z8249 Family history of ischemic heart disease and other diseases of the circulatory system: Secondary | ICD-10-CM | POA: Diagnosis not present

## 2020-08-19 DIAGNOSIS — O99893 Other specified diseases and conditions complicating puerperium: Secondary | ICD-10-CM | POA: Diagnosis not present

## 2020-08-19 DIAGNOSIS — R519 Headache, unspecified: Secondary | ICD-10-CM

## 2020-08-19 DIAGNOSIS — Z79899 Other long term (current) drug therapy: Secondary | ICD-10-CM | POA: Diagnosis not present

## 2020-08-19 DIAGNOSIS — O9903 Anemia complicating the puerperium: Secondary | ICD-10-CM | POA: Diagnosis not present

## 2020-08-19 DIAGNOSIS — Z791 Long term (current) use of non-steroidal anti-inflammatories (NSAID): Secondary | ICD-10-CM | POA: Insufficient documentation

## 2020-08-19 LAB — COMPREHENSIVE METABOLIC PANEL
ALT: 18 U/L (ref 0–44)
AST: 15 U/L (ref 15–41)
Albumin: 2.3 g/dL — ABNORMAL LOW (ref 3.5–5.0)
Alkaline Phosphatase: 84 U/L (ref 38–126)
Anion gap: 8 (ref 5–15)
BUN: 19 mg/dL (ref 6–20)
CO2: 24 mmol/L (ref 22–32)
Calcium: 8.4 mg/dL — ABNORMAL LOW (ref 8.9–10.3)
Chloride: 107 mmol/L (ref 98–111)
Creatinine, Ser: 0.65 mg/dL (ref 0.44–1.00)
GFR, Estimated: >60 mL/min (ref >60)
Glucose, Bld: 91 mg/dL (ref 70–99)
Potassium: 4 mmol/L (ref 3.5–5.1)
Sodium: 139 mmol/L (ref 135–145)
Total Bilirubin: 0.5 mg/dL (ref 0.3–1.2)
Total Protein: 5.4 g/dL — ABNORMAL LOW (ref 6.5–8.1)

## 2020-08-19 LAB — CBC
HCT: 24.8 % — ABNORMAL LOW (ref 36.0–46.0)
Hemoglobin: 8.1 g/dL — ABNORMAL LOW (ref 12.0–15.0)
MCH: 30.6 pg (ref 26.0–34.0)
MCHC: 32.7 g/dL (ref 30.0–36.0)
MCV: 93.6 fL (ref 80.0–100.0)
Platelets: 244 10*3/uL (ref 150–400)
RBC: 2.65 MIL/uL — ABNORMAL LOW (ref 3.87–5.11)
RDW: 14.3 % (ref 11.5–15.5)
WBC: 7.2 10*3/uL (ref 4.0–10.5)
nRBC: 0 % (ref 0.0–0.2)

## 2020-08-19 MED ORDER — DIPHENHYDRAMINE HCL 50 MG/ML IJ SOLN
12.5000 mg | Freq: Once | INTRAMUSCULAR | Status: AC
  Administered 2020-08-19: 12.5 mg via INTRAVENOUS
  Filled 2020-08-19: qty 1

## 2020-08-19 MED ORDER — DEXAMETHASONE SODIUM PHOSPHATE 10 MG/ML IJ SOLN
10.0000 mg | Freq: Once | INTRAMUSCULAR | Status: AC
  Administered 2020-08-19: 10 mg via INTRAVENOUS
  Filled 2020-08-19: qty 1

## 2020-08-19 MED ORDER — METOCLOPRAMIDE HCL 5 MG/ML IJ SOLN
10.0000 mg | Freq: Once | INTRAMUSCULAR | Status: AC
  Administered 2020-08-19: 10 mg via INTRAVENOUS
  Filled 2020-08-19: qty 2

## 2020-08-19 MED ORDER — FERROUS SULFATE 325 (65 FE) MG PO TABS: 325.0000 mg | ORAL_TABLET | ORAL | 0 refills | Status: DC

## 2020-08-19 MED ORDER — LACTATED RINGERS IV BOLUS
1000.0000 mL | Freq: Once | INTRAVENOUS | Status: AC
  Administered 2020-08-19: 1000 mL via INTRAVENOUS

## 2020-08-19 NOTE — MAU Note (Signed)
Vag Delivery on Sunday.  Was given the hypertension symptoms at d/c.  Denies having BP problems.  Has had a HA for the last couple days, taking Ibuprofen and Tylenol, not really helping.  Also has been nauseous.  Today has been experiencing pain in RUQ.

## 2020-08-19 NOTE — MAU Provider Note (Signed)
Chief Complaint:  Abdominal Pain   Event Date/Time   First Provider Initiated Contact with Patient 08/19/20 1739     HPI: Marie Morris is a 35 y.o. G1P1001 s/p SVD on 8/14 with PMH of anxiety and gestational diabetes who presents to maternity admissions reporting headache. Patient reports an ongoing headache since Wednesday. Headache are sometimes generalized and other times are only above the right eye. Has been taking Tylenol and Ibuprofen around the clock without relief. Last took Tylenol about 4 hours ago and Ibuprofen prior to arrival. Currently rates headache 4/10. Headache is worsened when the baby cries or when she cries. She denies elevated BP's, vision changes, or RUQ pain. She endorses feeling some intermittent palpitations during the evenings as well as feeling nauseous. Of note she reports she doesn't feel like she has been sleeping much nor does she feel as if she is drinking enough fluids. She tries to sleep in between feeding the baby as well as took a 1 hour nap today. She reports she has only had 1 12 oz bottle of water, some Gatorade and 1 diet coke today. She does feel like she is eating plenty.  Pregnancy Course:   Past Medical History:  Diagnosis Date   Allergies    Gestational diabetes    OB History  Gravida Para Term Preterm AB Living  1 1 1     1   SAB IAB Ectopic Multiple Live Births        0 1    # Outcome Date GA Lbr Len/2nd Weight Sex Delivery Anes PTL Lv  1 Term 08/14/20 [redacted]w[redacted]d 23:58 / 01:04 3605 g M Vag-Spont EPI  LIV   History reviewed. No pertinent surgical history. Family History  Problem Relation Age of Onset   Hypertension Father    Social History   Tobacco Use   Smoking status: Never   Smokeless tobacco: Never  Substance Use Topics   Alcohol use: Not Currently   Drug use: Never   Allergies  Allergen Reactions   Almond (Diagnostic)    Apple    Cherry    Hazelnut ([facility]) Allergy Skin Test    Peanut (Diagnostic)    Plum Pulp     Medications Prior to Admission  Medication Sig Dispense Refill Last Dose   acetaminophen (TYLENOL) 500 MG tablet Take 1,000 mg by mouth every 6 (six) hours as needed for mild pain, fever or headache.   08/19/2020   calcium carbonate (TUMS - DOSED IN MG ELEMENTAL CALCIUM) 500 MG chewable tablet Chew 500-1,000 mg by mouth 3 (three) times daily as needed for indigestion or heartburn.   Past Week   cetirizine (ZYRTEC) 10 MG tablet Take 10 mg by mouth daily.   08/18/2020   famotidine (PEPCID) 20 MG tablet Take 20 mg by mouth 2 (two) times daily as needed for heartburn or indigestion.   Past Week   FLUoxetine (PROZAC) 20 MG capsule Take 20 mg by mouth daily.   08/18/2020   ibuprofen (ADVIL) 600 MG tablet Take 1 tablet (600 mg total) by mouth every 6 (six) hours. 30 tablet 0 08/19/2020   Melatonin-Pyridoxine (MELATIN PO) Take 3 mg by mouth at bedtime as needed (sleep).   08/18/2020   Prenatal Vit-Fe Fum-FA-Omega (PRENATAL MULTI +DHA PO) Take 1 tablet by mouth daily.   08/18/2020    I have reviewed patient's Past Medical Hx, Surgical Hx, Family Hx, Social Hx, medications and allergies.   ROS:  Review of Systems  Constitutional: Negative.  Respiratory: Negative.    Cardiovascular:  Positive for palpitations. Negative for chest pain.  Gastrointestinal: Negative.   Genitourinary: Negative.   Musculoskeletal: Negative.   Neurological:  Positive for headaches.   Physical Exam  Patient Vitals for the past 24 hrs:  BP Temp Temp src Pulse Resp SpO2  08/19/20 1945 119/78 -- -- 70 -- --  08/19/20 1930 123/70 -- -- 62 -- --  08/19/20 1915 127/78 -- -- 69 -- --  08/19/20 1900 122/75 -- -- 70 -- --  08/19/20 1845 119/74 -- -- 75 -- --  08/19/20 1830 (!) 100/57 -- -- 73 -- --  08/19/20 1828 (!) 104/58 -- -- 74 -- --  08/19/20 1745 131/81 -- -- 84 -- --  08/19/20 1744 -- -- -- -- -- 97 %  08/19/20 1739 -- -- -- -- -- 97 %  08/19/20 1734 -- -- -- -- -- 98 %  08/19/20 1733 128/85 -- -- 92 -- --   08/19/20 1715 124/87 98.2 F (36.8 C) Oral 85 17 100 %   Constitutional: well-developed, well-nourished female, patient is tearful Cardiovascular: normal rate Respiratory: normal effort GI: abd soft, non-tender MS: extremities nontender, no edema, normal ROM Neurologic: alert and oriented x 4, no focal deficit     Labs: Results for orders placed or performed during the hospital encounter of 08/19/20 (from the past 24 hour(s))  CBC     Status: Abnormal   Collection Time: 08/19/20  6:27 PM  Result Value Ref Range   WBC 7.2 4.0 - 10.5 K/uL   RBC 2.65 (L) 3.87 - 5.11 MIL/uL   Hemoglobin 8.1 (L) 12.0 - 15.0 g/dL   HCT 22.6 (L) 33.3 - 54.5 %   MCV 93.6 80.0 - 100.0 fL   MCH 30.6 26.0 - 34.0 pg   MCHC 32.7 30.0 - 36.0 g/dL   RDW 62.5 63.8 - 93.7 %   Platelets 244 150 - 400 K/uL   nRBC 0.0 0.0 - 0.2 %  Comprehensive metabolic panel     Status: Abnormal   Collection Time: 08/19/20  6:27 PM  Result Value Ref Range   Sodium 139 135 - 145 mmol/L   Potassium 4.0 3.5 - 5.1 mmol/L   Chloride 107 98 - 111 mmol/L   CO2 24 22 - 32 mmol/L   Glucose, Bld 91 70 - 99 mg/dL   BUN 19 6 - 20 mg/dL   Creatinine, Ser 3.42 0.44 - 1.00 mg/dL   Calcium 8.4 (L) 8.9 - 10.3 mg/dL   Total Protein 5.4 (L) 6.5 - 8.1 g/dL   Albumin 2.3 (L) 3.5 - 5.0 g/dL   AST 15 15 - 41 U/L   ALT 18 0 - 44 U/L   Alkaline Phosphatase 84 38 - 126 U/L   Total Bilirubin 0.5 0.3 - 1.2 mg/dL   GFR, Estimated >87 >68 mL/min   Anion gap 8 5 - 15    Imaging:    MAU Course: Orders Placed This Encounter  Procedures   CBC   Comprehensive metabolic panel   ED EKG   Discharge patient   Meds ordered this encounter  Medications   metoCLOPramide (REGLAN) injection 10 mg   dexamethasone (DECADRON) injection 10 mg   diphenhydrAMINE (BENADRYL) injection 12.5 mg   lactated ringers bolus 1,000 mL   ferrous sulfate 325 (65 FE) MG tablet    Sig: Take 1 tablet (325 mg total) by mouth every other day.    Dispense:  30 tablet  Refill:  0    Order Specific Question:   Supervising Provider    Answer:   Reva Bores [[id number]]    MDM: CBC, Hgb 8.1; Patient was offered IV Fereheme but declines. Wishes to go home and rest. Will send PO iron home. CMP wnl Serial BP's, all normotensive EKG shows normal sinus rhythm Headache cocktail and IVF bolus with improvement in headache. Now rating 0/10. I discussed with patient that postpartum pre-e unlikely. I recommend PO iron supplementation every other day given drop in hgb and discussed the importance eating/drinking regularly, sleeping as much as possible, and accepting help from family and friends. We discussed warning signs and symptoms of when to return. Patient does report history of anxiety and has appointment on Tuesday.   Assessment: 1. Postpartum headache   2. Anemia, unspecified type     Plan: Discharge home in stable condition Strict return precautions given Patient to follow up in the office as scheduled on Tuesday 8/23 Return to MAU as needed    Follow-up Information     Gynecology, [facility] Obstetrics And Follow up.   Specialty: Obstetrics and Gynecology Why: as scheduled on Tuesday. Return to MAU as needed. Contact information: [address] [place] [address] [phone]                 Allergies as of 08/19/2020       Reactions   Almond (diagnostic)    Apple    Cherry    Hazelnut (filbert) Allergy Skin Test    Peanut (diagnostic)    Plum Pulp         Medication List     TAKE these medications    acetaminophen 500 MG tablet Commonly known as: TYLENOL Take 1,000 mg by mouth every 6 (six) hours as needed for mild pain, fever or headache.   calcium carbonate 500 MG chewable tablet Commonly known as: TUMS - dosed in mg elemental calcium Chew 500-1,000 mg by mouth 3 (three) times daily as needed for indigestion or heartburn.   cetirizine 10 MG tablet Commonly known as: ZYRTEC Take 10 mg by mouth daily.    famotidine 20 MG tablet Commonly known as: PEPCID Take 20 mg by mouth 2 (two) times daily as needed for heartburn or indigestion.   ferrous sulfate 325 (65 FE) MG tablet Take 1 tablet (325 mg total) by mouth every other day.   FLUoxetine 20 MG capsule Commonly known as: PROZAC Take 20 mg by mouth daily.   ibuprofen 600 MG tablet Commonly known as: ADVIL Take 1 tablet (600 mg total) by mouth every 6 (six) hours.   MELATIN PO Take 3 mg by mouth at bedtime as needed (sleep).   PRENATAL MULTI +DHA PO Take 1 tablet by mouth daily.          Camelia Eng, MSN, CNM 08/19/2020 8:03 PM

## 2020-08-21 DIAGNOSIS — R0602 Shortness of breath: Secondary | ICD-10-CM | POA: Diagnosis not present

## 2020-08-21 DIAGNOSIS — J069 Acute upper respiratory infection, unspecified: Secondary | ICD-10-CM | POA: Diagnosis not present

## 2020-08-21 DIAGNOSIS — Z03818 Encounter for observation for suspected exposure to other biological agents ruled out: Secondary | ICD-10-CM | POA: Diagnosis not present

## 2020-08-21 DIAGNOSIS — R519 Headache, unspecified: Secondary | ICD-10-CM | POA: Diagnosis not present

## 2020-08-26 ENCOUNTER — Telehealth (HOSPITAL_COMMUNITY): Payer: Self-pay | Admitting: *Deleted

## 2020-08-26 NOTE — Telephone Encounter (Signed)
Mom reports feeling well. No concerns about herself. EPDS = 6 (Hospital score = 4) Mom reports baby is fine. No concerns about baby. Feeding, peeing, and pooping without difficulty.  Duffy Rhody, RN 08-26-2020 at 1:34pm

## 2020-08-30 DIAGNOSIS — F411 Generalized anxiety disorder: Secondary | ICD-10-CM | POA: Diagnosis not present

## 2020-08-30 DIAGNOSIS — Z630 Problems in relationship with spouse or partner: Secondary | ICD-10-CM | POA: Diagnosis not present

## 2020-08-31 DIAGNOSIS — F419 Anxiety disorder, unspecified: Secondary | ICD-10-CM | POA: Diagnosis not present

## 2020-08-31 DIAGNOSIS — F320 Major depressive disorder, single episode, mild: Secondary | ICD-10-CM | POA: Diagnosis not present

## 2020-09-23 DIAGNOSIS — Z30430 Encounter for insertion of intrauterine contraceptive device: Secondary | ICD-10-CM | POA: Diagnosis not present

## 2020-09-23 DIAGNOSIS — Z3202 Encounter for pregnancy test, result negative: Secondary | ICD-10-CM | POA: Diagnosis not present

## 2020-09-26 DIAGNOSIS — M6281 Muscle weakness (generalized): Secondary | ICD-10-CM | POA: Diagnosis not present

## 2020-10-10 DIAGNOSIS — M6281 Muscle weakness (generalized): Secondary | ICD-10-CM | POA: Diagnosis not present

## 2020-10-19 DIAGNOSIS — H1045 Other chronic allergic conjunctivitis: Secondary | ICD-10-CM | POA: Diagnosis not present

## 2020-10-19 DIAGNOSIS — H52223 Regular astigmatism, bilateral: Secondary | ICD-10-CM | POA: Diagnosis not present

## 2020-10-19 DIAGNOSIS — H04123 Dry eye syndrome of bilateral lacrimal glands: Secondary | ICD-10-CM | POA: Diagnosis not present

## 2020-10-24 DIAGNOSIS — M6281 Muscle weakness (generalized): Secondary | ICD-10-CM | POA: Diagnosis not present

## 2020-11-01 DIAGNOSIS — M6281 Muscle weakness (generalized): Secondary | ICD-10-CM | POA: Diagnosis not present

## 2020-11-03 DIAGNOSIS — F411 Generalized anxiety disorder: Secondary | ICD-10-CM | POA: Diagnosis not present

## 2020-11-03 DIAGNOSIS — Z630 Problems in relationship with spouse or partner: Secondary | ICD-10-CM | POA: Diagnosis not present

## 2020-11-07 DIAGNOSIS — T8332XA Displacement of intrauterine contraceptive device, initial encounter: Secondary | ICD-10-CM | POA: Diagnosis not present

## 2020-11-07 DIAGNOSIS — Z30431 Encounter for routine checking of intrauterine contraceptive device: Secondary | ICD-10-CM | POA: Diagnosis not present

## 2020-11-08 DIAGNOSIS — M6281 Muscle weakness (generalized): Secondary | ICD-10-CM | POA: Diagnosis not present

## 2020-11-09 DIAGNOSIS — T8332XA Displacement of intrauterine contraceptive device, initial encounter: Secondary | ICD-10-CM | POA: Diagnosis not present

## 2020-11-09 DIAGNOSIS — Z30431 Encounter for routine checking of intrauterine contraceptive device: Secondary | ICD-10-CM | POA: Diagnosis not present

## 2020-11-10 ENCOUNTER — Ambulatory Visit
Admission: RE | Admit: 2020-11-10 | Discharge: 2020-11-10 | Disposition: A | Discharge status: Home / Self Care | Payer: BC Managed Care – PPO | Source: Ambulatory Visit | Attending: Obstetrics and Gynecology | Admitting: Obstetrics and Gynecology

## 2020-11-10 ENCOUNTER — Other Ambulatory Visit: Payer: Self-pay | Admitting: Obstetrics and Gynecology

## 2020-11-10 DIAGNOSIS — T8332XA Displacement of intrauterine contraceptive device, initial encounter: Secondary | ICD-10-CM

## 2020-11-11 NOTE — H&P (Signed)
Marie Morris is an 35 y.o. G1P1001 who is admitted for Diagnostic Laparoscopy with removal of intrauterine device from the pelvis.  Patient presented on 11/08/2019 for routine 6 week IUD string check after having IUD placed on 09/23/20 at her 6 week postpartum visit. The placement was unremarkable. Patient did not note any pelvic pain or unusual findings/pain after placement. She did note some mild lower back pain but felt this was related to recent postpartum state. IUD strings were not visualized at her string check and TVUS was performed on 11/09/20 which did not reveal the IUD within the uterus. KUB performed on 11/10 which revealed IUD within the right hemipelvis.  Pelvis US (11/09/20): Uterus 7.17 x 3.11 x 4.63cm Endometrial thickness 0.16cm R ovary 3.67cm  L ovary 2.89cm Anteverted uterus. No uterine anomalies seen. Endometrium thin. IUD not seen within endometrial cavity/uterus. Bilateral ovaries wnl. No adnexal masses seen.  KUB (11/10/20): FINDINGS: Two supine frontal views of the abdomen and pelvis are obtained. The hemidiaphragms are excluded by collimation. IUD overlies the right hemipelvis just inferior to the right sacroiliac joint. No bowel obstruction or ileus. No masses or abnormal calcifications. No acute bony abnormalities.   IMPRESSION: 1. IUD overlying the superior right hemipelvis, at a level just inferior to the right SI joint. 2. Unremarkable bowel gas pattern.  Patient Active Problem List   Diagnosis Date Noted   Gestational diabetes 08/12/2020   Obesity (BMI 30.0-34.9) 08/11/2020   Anxiety 08/11/2020   Gestational diabetes mellitus (GDM), antepartum 05/18/2020   MEDICAL/FAMILY/SOCIAL HX: No LMP recorded.    Past Medical History:  Diagnosis Date   Allergies    Gestational diabetes     No past surgical history on file.  Family History  Problem Relation Age of Onset   Hypertension Father     Social History:  reports that she has never smoked. She  has never used smokeless tobacco. She reports that she does not currently use alcohol. She reports that she does not use drugs.  ALLERGIES/MEDS:  Allergies:  Allergies  Allergen Reactions   Almond (Diagnostic)    Apple    Cherry    Hazelnut [facility]) Allergy Skin Test    Peanut (Diagnostic)    Plum Pulp     No medications prior to admission.     Review of Systems  Constitutional: Negative.   HENT: Negative.    Eyes: Negative.   Respiratory: Negative.    Cardiovascular: Negative.   Gastrointestinal: Negative.   Genitourinary: Negative.   Musculoskeletal: Negative.   Skin: Negative.   Neurological: Negative.   Endo/Heme/Allergies: Negative.   Psychiatric/Behavioral: Negative.     currently breastfeeding. Gen:  NAD, pleasant and cooperative Cardio:  RRR Pulm:  CTAB, no wheezes/rales/rhonchi Abd:  Soft, non-distended, non-tender throughout, no rebound/guarding Ext:  No bilateral LE edema, no bilateral calf tenderness Pelvic: Labia - unremarkable, vagina - pink moist mucosa, no lesions or abnormal discharge, cervix - no discharge or lesions or CMT, IUD strings are not visible (unable to see with Cytobrush or mild cervical dilation)  No results found for this or any previous visit (from the past 24 hour(s)).  No results found.   ASSESSMENT/PLAN: AUNE ADAMI is a 35 y.o. G1P1001 who is admitted for Diagnostic Laparoscopy with removal of intrauterine device from the pelvis.  - Admit to [facility] Main OR - Admit labs (CBC, T&S, COVID screen) - Diet:  NPO - IVF:  Per anesthesia - VTE Prophylaxis:  SCDs - Antibiotics: None - D/C home  same day  Consents: Patient was consented for diagnostic laparoscopy with removal of IUD from the pelvis. We discussed risks of this procedure which include but are not limited to injury to surrounding organs, blood loss, need for blood transfusion, infection, hernia development, and VTE.  Patient was consented for blood products.  The patient  is aware that bleeding may result in the need for a blood transfusion which includes risk of transmission of HIV (1:2 million), Hepatitis C (1:2 million), and Hepatitis B (1:200 thousand) and transfusion reaction.  Patient voiced understanding of the above risks as well as understanding of indications for blood transfusion.  Steva Ready, DO

## 2020-11-17 ENCOUNTER — Encounter (HOSPITAL_COMMUNITY): Payer: Self-pay | Admitting: Obstetrics and Gynecology

## 2020-11-17 ENCOUNTER — Other Ambulatory Visit: Payer: Self-pay

## 2020-11-17 NOTE — Progress Notes (Signed)
Mrs Gillock denies chest pain or shortness of breath. Patient denies having any s/s of Covid in her household.  Patient denies any known exposure to Covid.  PCP is Dr. Donalee Citrin with [facility].  I instructed patient to shower with antibiotic soap, if it is available.  Dry off with a clean towel. Do not put lotion, powder, cologne or deodorant or makeup.No jewelry or piercings. Men may shave their face and neck. Woman should not shave. No nail polish, artificial or acrylic nails. Wear clean clothes, brush your teeth. Glasses, contact lens,dentures or partials may not be worn in the OR. If you need to wear them, please bring a case for glasses, do not wear contacts or bring a case, the hospital does not have contact cases, dentures or partials will have to be removed , make sure they are clean, we will provide a denture cup to put them in. You will need some one to drive you home and a responsible person over the age of 77 to stay with you for the first 24 hours after surgery.

## 2020-11-18 ENCOUNTER — Ambulatory Visit (HOSPITAL_COMMUNITY)
Admission: RE | Admit: 2020-11-18 | Discharge: 2020-11-18 | Disposition: A | Discharge status: Home / Self Care | Payer: BC Managed Care – PPO | Attending: Obstetrics and Gynecology | Admitting: Obstetrics and Gynecology

## 2020-11-18 ENCOUNTER — Encounter (HOSPITAL_COMMUNITY)
Admission: RE | Disposition: A | Discharge status: Home / Self Care | Payer: Self-pay | Source: Home / Self Care | Attending: Obstetrics and Gynecology

## 2020-11-18 ENCOUNTER — Other Ambulatory Visit: Payer: Self-pay

## 2020-11-18 ENCOUNTER — Ambulatory Visit (HOSPITAL_COMMUNITY): Payer: BC Managed Care – PPO | Admitting: Anesthesiology

## 2020-11-18 ENCOUNTER — Encounter (HOSPITAL_COMMUNITY): Payer: Self-pay | Admitting: Obstetrics and Gynecology

## 2020-11-18 DIAGNOSIS — T8332XA Displacement of intrauterine contraceptive device, initial encounter: Secondary | ICD-10-CM | POA: Insufficient documentation

## 2020-11-18 DIAGNOSIS — F419 Anxiety disorder, unspecified: Secondary | ICD-10-CM | POA: Insufficient documentation

## 2020-11-18 DIAGNOSIS — Y762 Prosthetic and other implants, materials and accessory obstetric and gynecological devices associated with adverse incidents: Secondary | ICD-10-CM | POA: Insufficient documentation

## 2020-11-18 DIAGNOSIS — F418 Other specified anxiety disorders: Secondary | ICD-10-CM | POA: Diagnosis not present

## 2020-11-18 DIAGNOSIS — E119 Type 2 diabetes mellitus without complications: Secondary | ICD-10-CM | POA: Diagnosis not present

## 2020-11-18 DIAGNOSIS — D649 Anemia, unspecified: Secondary | ICD-10-CM | POA: Insufficient documentation

## 2020-11-18 DIAGNOSIS — J45909 Unspecified asthma, uncomplicated: Secondary | ICD-10-CM | POA: Diagnosis not present

## 2020-11-18 DIAGNOSIS — F32A Depression, unspecified: Secondary | ICD-10-CM | POA: Insufficient documentation

## 2020-11-18 DIAGNOSIS — N842 Polyp of vagina: Secondary | ICD-10-CM | POA: Diagnosis not present

## 2020-11-18 DIAGNOSIS — T8332XD Displacement of intrauterine contraceptive device, subsequent encounter: Secondary | ICD-10-CM | POA: Diagnosis not present

## 2020-11-18 HISTORY — DX: Anemia, unspecified: D64.9

## 2020-11-18 HISTORY — DX: Depression, unspecified: F32.A

## 2020-11-18 HISTORY — DX: Unspecified asthma, uncomplicated: J45.909

## 2020-11-18 HISTORY — PX: LAPAROSCOPY: SHX197

## 2020-11-18 LAB — CBC
HCT: 41.1 % (ref 36.0–46.0)
Hemoglobin: 13.4 g/dL (ref 12.0–15.0)
MCH: 29.6 pg (ref 26.0–34.0)
MCHC: 32.6 g/dL (ref 30.0–36.0)
MCV: 90.7 fL (ref 80.0–100.0)
Platelets: 257 10*3/uL (ref 150–400)
RBC: 4.53 MIL/uL (ref 3.87–5.11)
RDW: 13.3 % (ref 11.5–15.5)
WBC: 6.3 10*3/uL (ref 4.0–10.5)
nRBC: 0 % (ref 0.0–0.2)

## 2020-11-18 LAB — POCT PREGNANCY, URINE: Preg Test, Ur: NEGATIVE

## 2020-11-18 LAB — TYPE AND SCREEN
ABO/RH(D): O POS
Antibody Screen: NEGATIVE

## 2020-11-18 SURGERY — LAPAROSCOPY, DIAGNOSTIC
Anesthesia: General | Site: Abdomen

## 2020-11-18 MED ORDER — FENTANYL CITRATE (PF) 250 MCG/5ML IJ SOLN
INTRAMUSCULAR | Status: AC
  Filled 2020-11-18: qty 5

## 2020-11-18 MED ORDER — ORAL CARE MOUTH RINSE: 15.0000 mL | Freq: Once | OROMUCOSAL | Status: AC

## 2020-11-18 MED ORDER — PHENYLEPHRINE 40 MCG/ML (10ML) SYRINGE FOR IV PUSH (FOR BLOOD PRESSURE SUPPORT)
PREFILLED_SYRINGE | INTRAVENOUS | Status: AC
  Filled 2020-11-18: qty 10

## 2020-11-18 MED ORDER — CHLORHEXIDINE GLUCONATE 0.12 % MT SOLN: 15.0000 mL | Freq: Once | OROMUCOSAL | Status: AC

## 2020-11-18 MED ORDER — BUPIVACAINE HCL (PF) 0.25 % IJ SOLN
INTRAMUSCULAR | Status: AC
  Filled 2020-11-18: qty 30

## 2020-11-18 MED ORDER — PROPOFOL 10 MG/ML IV BOLUS
INTRAVENOUS | Status: DC | PRN
  Administered 2020-11-18: 140 mg via INTRAVENOUS

## 2020-11-18 MED ORDER — BUPIVACAINE HCL (PF) 0.25 % IJ SOLN
INTRAMUSCULAR | Status: DC | PRN
  Administered 2020-11-18: 30 mL

## 2020-11-18 MED ORDER — MIDAZOLAM HCL 2 MG/2ML IJ SOLN
INTRAMUSCULAR | Status: AC
  Filled 2020-11-18: qty 2

## 2020-11-18 MED ORDER — ONDANSETRON HCL 4 MG/2ML IJ SOLN
INTRAMUSCULAR | Status: AC
  Filled 2020-11-18: qty 6

## 2020-11-18 MED ORDER — DEXAMETHASONE SODIUM PHOSPHATE 10 MG/ML IJ SOLN
INTRAMUSCULAR | Status: DC | PRN
  Administered 2020-11-18: 5 mg via INTRAVENOUS

## 2020-11-18 MED ORDER — ACETAMINOPHEN 10 MG/ML IV SOLN
INTRAVENOUS | Status: DC | PRN
  Administered 2020-11-18: 1000 mg via INTRAVENOUS

## 2020-11-18 MED ORDER — LIDOCAINE 2% (20 MG/ML) 5 ML SYRINGE
INTRAMUSCULAR | Status: DC | PRN
  Administered 2020-11-18: 60 mg via INTRAVENOUS

## 2020-11-18 MED ORDER — FENTANYL CITRATE (PF) 250 MCG/5ML IJ SOLN
INTRAMUSCULAR | Status: DC | PRN
  Administered 2020-11-18: 100 ug via INTRAVENOUS

## 2020-11-18 MED ORDER — CHLORHEXIDINE GLUCONATE 0.12 % MT SOLN
OROMUCOSAL | Status: AC
  Administered 2020-11-18: 15 mL via OROMUCOSAL
  Filled 2020-11-18: qty 15

## 2020-11-18 MED ORDER — IBUPROFEN 800 MG PO TABS: 800.0000 mg | ORAL_TABLET | Freq: Three times a day (TID) | ORAL | 0 refills | Status: DC | PRN

## 2020-11-18 MED ORDER — OXYCODONE HCL 5 MG PO TABS: 5.0000 mg | ORAL_TABLET | Freq: Four times a day (QID) | ORAL | 0 refills | Status: AC | PRN

## 2020-11-18 MED ORDER — SUGAMMADEX SODIUM 200 MG/2ML IV SOLN
INTRAVENOUS | Status: DC | PRN
  Administered 2020-11-18: 200 mg via INTRAVENOUS

## 2020-11-18 MED ORDER — LIDOCAINE 2% (20 MG/ML) 5 ML SYRINGE
INTRAMUSCULAR | Status: AC
  Filled 2020-11-18: qty 15

## 2020-11-18 MED ORDER — ACETAMINOPHEN 10 MG/ML IV SOLN
INTRAVENOUS | Status: AC
  Filled 2020-11-18: qty 100

## 2020-11-18 MED ORDER — ROCURONIUM BROMIDE 10 MG/ML (PF) SYRINGE
PREFILLED_SYRINGE | INTRAVENOUS | Status: AC
  Filled 2020-11-18: qty 20

## 2020-11-18 MED ORDER — DEXAMETHASONE SODIUM PHOSPHATE 10 MG/ML IJ SOLN
INTRAMUSCULAR | Status: AC
  Filled 2020-11-18: qty 3

## 2020-11-18 MED ORDER — ROCURONIUM BROMIDE 10 MG/ML (PF) SYRINGE
PREFILLED_SYRINGE | INTRAVENOUS | Status: DC | PRN
  Administered 2020-11-18: 60 mg via INTRAVENOUS

## 2020-11-18 MED ORDER — LACTATED RINGERS IV SOLN: INTRAVENOUS | Status: DC

## 2020-11-18 MED ORDER — MIDAZOLAM HCL 2 MG/2ML IJ SOLN
INTRAMUSCULAR | Status: DC | PRN
Start: 2020-11-18 — End: 2020-11-18
  Administered 2020-11-18: 2 mg via INTRAVENOUS

## 2020-11-18 MED ORDER — ONDANSETRON HCL 4 MG/2ML IJ SOLN
INTRAMUSCULAR | Status: DC | PRN
  Administered 2020-11-18: 4 mg via INTRAVENOUS

## 2020-11-18 SURGICAL SUPPLY — 27 items
ADH SKN CLS APL DERMABOND .7 (GAUZE/BANDAGES/DRESSINGS) ×1
BAG COUNTER SPONGE SURGICOUNT (BAG) ×2 IMPLANT
BAG SPEC RTRVL LRG 6X4 10 (ENDOMECHANICALS)
BAG SPNG CNTER NS LX DISP (BAG) ×1
DERMABOND ADVANCED (GAUZE/BANDAGES/DRESSINGS) ×1
DERMABOND ADVANCED .7 DNX12 (GAUZE/BANDAGES/DRESSINGS) IMPLANT
GLOVE SURG ENC MOIS LTX SZ6.5 (GLOVE) ×4 IMPLANT
GLOVE SURG UNDER POLY LF SZ6.5 (GLOVE) ×2 IMPLANT
GLOVE SURG UNDER POLY LF SZ7 (GLOVE) ×4 IMPLANT
GOWN STRL REUS W/ TWL LRG LVL3 (GOWN DISPOSABLE) ×2 IMPLANT
GOWN STRL REUS W/TWL LRG LVL3 (GOWN DISPOSABLE) ×4
KIT TURNOVER KIT B (KITS) ×2 IMPLANT
NS IRRIG 1000ML POUR BTL (IV SOLUTION) ×2 IMPLANT
PACK LAPAROSCOPY BASIN (CUSTOM PROCEDURE TRAY) ×2 IMPLANT
PACK TRENDGUARD 450 HYBRID PRO (MISCELLANEOUS) IMPLANT
POUCH SPECIMEN RETRIEVAL 10MM (ENDOMECHANICALS) IMPLANT
PROTECTOR NERVE ULNAR (MISCELLANEOUS) ×4 IMPLANT
SET TUBE SMOKE EVAC HIGH FLOW (TUBING) ×2 IMPLANT
SLEEVE ENDOPATH XCEL 5M (ENDOMECHANICALS) ×2 IMPLANT
SUT VIC AB 4-0 PS2 27 (SUTURE) ×2 IMPLANT
SUT VICRYL 0 UR6 27IN ABS (SUTURE) ×1 IMPLANT
TOWEL GREEN STERILE FF (TOWEL DISPOSABLE) ×4 IMPLANT
TRAY FOLEY W/BAG SLVR 14FR (SET/KITS/TRAYS/PACK) ×2 IMPLANT
TRENDGUARD 450 HYBRID PRO PACK (MISCELLANEOUS) ×2
TROCAR XCEL NON-BLD 11X100MML (ENDOMECHANICALS) ×1 IMPLANT
TROCAR XCEL NON-BLD 5MMX100MML (ENDOMECHANICALS) ×2 IMPLANT
WARMER LAPAROSCOPE (MISCELLANEOUS) ×2 IMPLANT

## 2020-11-18 NOTE — Interval H&P Note (Signed)
History and Physical Interval Note:  11/18/2020 1:20 PM  Marie Morris  has presented today for surgery, with the diagnosis of IUD Migration to Pelvis.  The various methods of treatment have been discussed with the patient and family. After consideration of risks, benefits and other options for treatment, the patient has consented to  Procedure(s): LAPAROSCOPY DIAGNOSTIC FOR IUD REMOVAL FROM PELVIS. (N/A) as a surgical intervention.  The patient's history has been reviewed, patient examined, no change in status, stable for surgery.  I have reviewed the patient's chart and labs.  Questions were answered to the patient's satisfaction.     Steva Ready

## 2020-11-18 NOTE — Op Note (Signed)
Pre Op Dx:   1. Migrated Intrauterine device  Post Op Dx:   Same as pre-operative diagnoses  Procedure:   Laparoscopic Removal of Intrauterine device  Surgeon:  Dr. Katrinka Blazing. Connye Burkitt Assistants:  Dr. Gerald Leitz (assistant needed due to the complexity of the anatomy) Anesthesia:  General  EBL:  5cc  IVF:  1400cc UOP:  250cc  Drains:  Foley catheter - removed at end of case Specimen removed:  Intrauterine device  Vaginal polyp - sent to pathology Device(s) implanted:  None Case Type: Clean Findings: IUD in posterior cul-de-sac intact - attached to portion of right tubal fimbriae. A ~1.5cm vaginal polyp near the introitus inadvertently avulsed while placing the speculum. Complications: None Indications:  35 y.o. G1P1001 with migrated IUD within the pelvis confirmed on KUB. Description of procedure:   After informed consent the patient was taken to the operating room where general endotracheal anesthesia was administered and found to be adequate. She was placed in dorsal lithotomy position. She was prepped and draped in the usual sterile fashion. An operative timeout was performed. A Hulka tenaculum was placed in the cervix and her bladder was drained. An approximately 1.5cm vaginal polyp was avulsed inadvertently and silver nitrate and pressure were placed for adequate hemostasis. The abdomen was entered through an intraumbilical incision using a 9mm trocar under direct visualization and a pneumoperitoneum was established. Bilateral lower abdominal ports were placed under direct visualization. Atraumatic entry confirmed. The IUD was visualized in the posterior cul-de-sac and there was a portion of right tubal fimbria which was attached and excised with electrosurgery. The IUD was then removed through the 75mm port in its entirety. The left lower quadrant and umbilical ports were withdrawn under visualization and the pneumoperitoneum was reduced completely, assisted with the patient having two  deep breaths. The camera port was then withdrawn. The fascia at the umbilicus was closed using 0-Vicryl in an open technique. The skin was closed with 4-0 Vicryl in subcuticular fashion. The tenaculum was removed and cervical hemostasis achieved with silver nitrate. The patient was returned to dorsal supine position, awakened and extubated in the OR having appeared to tolerate the procedure well. All sponge, needle, and instrument counts were correct x 2 at the end of the case. Disposition:  PACU   Steva Ready, DO

## 2020-11-18 NOTE — Anesthesia Procedure Notes (Signed)
Procedure Name: Intubation Date/Time: 11/18/2020 1:36 PM Performed by: Elliot Dally, CRNA Pre-anesthesia Checklist: Patient identified, Emergency Drugs available, Suction available and Patient being monitored Patient Re-evaluated:Patient Re-evaluated prior to induction Oxygen Delivery Method: Circle System Utilized Preoxygenation: Pre-oxygenation with 100% oxygen Induction Type: IV induction Ventilation: Mask ventilation without difficulty Laryngoscope Size: Miller and 2 Grade View: Grade I Tube type: Oral Tube size: 7.0 mm Number of attempts: 1 Airway Equipment and Method: Stylet and Oral airway Placement Confirmation: ETT inserted through vocal cords under direct vision, positive ETCO2 and breath sounds checked- equal and bilateral Secured at: 21 cm Tube secured with: Tape Dental Injury: Teeth and Oropharynx as per pre-operative assessment

## 2020-11-18 NOTE — Transfer of Care (Signed)
Immediate Anesthesia Transfer of Care Note  Patient: Marie Morris  Procedure(s) Performed: LAPAROSCOPY DIAGNOSTIC FOR IUD REMOVAL FROM PELVIS. (Abdomen)  Patient Location: PACU  Anesthesia Type:General  Level of Consciousness: awake, alert  and oriented  Airway & Oxygen Therapy: Patient connected to face mask oxygen  Post-op Assessment: Post -op Vital signs reviewed and stable  Post vital signs: stable  Last Vitals:  Vitals Value Taken Time  BP    Temp    Pulse 97 11/18/20 1449  Resp 12 11/18/20 1449  SpO2 100 % 11/18/20 1449  Vitals shown include unvalidated device data.  Last Pain:  Vitals:   11/18/20 1140  TempSrc:   PainSc: 0-No pain         Complications: No notable events documented.

## 2020-11-18 NOTE — Anesthesia Preprocedure Evaluation (Addendum)
Anesthesia Evaluation  Patient identified by MRN, date of birth, ID band Patient awake    Reviewed: Allergy & Precautions, NPO status , Patient's Chart, lab work & pertinent test results  History of Anesthesia Complications Negative for: history of anesthetic complications  Airway Mallampati: II  TM Distance: >3 FB Neck ROM: Full    Dental  (+) Dental Advisory Given, Teeth Intact   Pulmonary neg shortness of breath, asthma , neg sleep apnea, neg recent URI, neg PE   breath sounds clear to auscultation       Cardiovascular negative cardio ROS   Rhythm:Regular     Neuro/Psych PSYCHIATRIC DISORDERS Anxiety Depression negative neurological ROS     GI/Hepatic negative GI ROS, Neg liver ROS,   Endo/Other  diabetes  Renal/GU negative Renal ROS     Musculoskeletal negative musculoskeletal ROS (+)   Abdominal   Peds  Hematology  (+) Blood dyscrasia, anemia , Lab Results      Component                Value               Date                      WBC                      7.2                 08/19/2020                HGB                      8.1 (L)             08/19/2020                HCT                      24.8 (L)            08/19/2020                MCV                      93.6                08/19/2020                PLT                      244                 08/19/2020              Anesthesia Other Findings Rogue IUD  Reproductive/Obstetrics                            Anesthesia Physical Anesthesia Plan  ASA: 2  Anesthesia Plan: General   Post-op Pain Management: Toradol IV (intra-op) and Ofirmev IV (intra-op)   Induction: Intravenous  PONV Risk Score and Plan: 3 and Ondansetron and Dexamethasone  Airway Management Planned: Oral ETT  Additional Equipment: None  Intra-op Plan:   Post-operative Plan: Extubation in OR  Informed Consent: I have reviewed the patients History  and Physical, chart, labs and discussed the procedure including the risks, benefits and alternatives  for the proposed anesthesia with the patient or authorized representative who has indicated his/her understanding and acceptance.     Dental advisory given  Plan Discussed with: CRNA and Anesthesiologist  Anesthesia Plan Comments:         Anesthesia Quick Evaluation

## 2020-11-19 LAB — RPR: RPR Ser Ql: NONREACTIVE

## 2020-11-21 ENCOUNTER — Encounter (HOSPITAL_COMMUNITY): Payer: Self-pay | Admitting: Obstetrics and Gynecology

## 2020-11-21 NOTE — Anesthesia Postprocedure Evaluation (Signed)
Anesthesia Post Note  Patient: Marie Morris  Procedure(s) Performed: LAPAROSCOPY DIAGNOSTIC FOR IUD REMOVAL FROM PELVIS. (Abdomen)     Patient location during evaluation: PACU Anesthesia Type: General Level of consciousness: awake and alert Pain management: pain level controlled Vital Signs Assessment: post-procedure vital signs reviewed and stable Respiratory status: spontaneous breathing, nonlabored ventilation, respiratory function stable and patient connected to nasal cannula oxygen Cardiovascular status: blood pressure returned to baseline and stable Postop Assessment: no apparent nausea or vomiting Anesthetic complications: no   No notable events documented.  Last Vitals:  Vitals:   11/18/20 1534 11/18/20 1545  BP: 114/72 111/75  Pulse: 71 66  Resp: 14 18  Temp:  36.4 C  SpO2: 99% 99%    Last Pain:  Vitals:   11/18/20 1545  TempSrc:   PainSc: 0-No pain                 Tonishia Steffy

## 2020-11-22 LAB — SURGICAL PATHOLOGY

## 2020-12-02 DIAGNOSIS — Z9889 Other specified postprocedural states: Secondary | ICD-10-CM | POA: Diagnosis not present

## 2020-12-02 DIAGNOSIS — Z319 Encounter for procreative management, unspecified: Secondary | ICD-10-CM | POA: Diagnosis not present

## 2020-12-19 DIAGNOSIS — R1032 Left lower quadrant pain: Secondary | ICD-10-CM | POA: Diagnosis not present

## 2021-01-23 DIAGNOSIS — N9089 Other specified noninflammatory disorders of vulva and perineum: Secondary | ICD-10-CM | POA: Diagnosis not present

## 2021-01-23 DIAGNOSIS — B3731 Acute candidiasis of vulva and vagina: Secondary | ICD-10-CM | POA: Diagnosis not present

## 2021-01-26 DIAGNOSIS — N764 Abscess of vulva: Secondary | ICD-10-CM | POA: Diagnosis not present

## 2021-02-10 DIAGNOSIS — F320 Major depressive disorder, single episode, mild: Secondary | ICD-10-CM | POA: Diagnosis not present

## 2021-02-17 DIAGNOSIS — F320 Major depressive disorder, single episode, mild: Secondary | ICD-10-CM | POA: Diagnosis not present

## 2021-02-24 DIAGNOSIS — F320 Major depressive disorder, single episode, mild: Secondary | ICD-10-CM | POA: Diagnosis not present

## 2021-03-03 DIAGNOSIS — F320 Major depressive disorder, single episode, mild: Secondary | ICD-10-CM | POA: Diagnosis not present

## 2021-03-10 DIAGNOSIS — F320 Major depressive disorder, single episode, mild: Secondary | ICD-10-CM | POA: Diagnosis not present

## 2021-03-15 DIAGNOSIS — F320 Major depressive disorder, single episode, mild: Secondary | ICD-10-CM | POA: Diagnosis not present

## 2021-03-24 DIAGNOSIS — F320 Major depressive disorder, single episode, mild: Secondary | ICD-10-CM | POA: Diagnosis not present

## 2021-04-07 DIAGNOSIS — F320 Major depressive disorder, single episode, mild: Secondary | ICD-10-CM | POA: Diagnosis not present

## 2021-04-19 DIAGNOSIS — F411 Generalized anxiety disorder: Secondary | ICD-10-CM | POA: Diagnosis not present

## 2021-04-19 DIAGNOSIS — F320 Major depressive disorder, single episode, mild: Secondary | ICD-10-CM | POA: Diagnosis not present

## 2021-04-21 DIAGNOSIS — F320 Major depressive disorder, single episode, mild: Secondary | ICD-10-CM | POA: Diagnosis not present

## 2021-04-28 DIAGNOSIS — F320 Major depressive disorder, single episode, mild: Secondary | ICD-10-CM | POA: Diagnosis not present

## 2021-05-05 DIAGNOSIS — F320 Major depressive disorder, single episode, mild: Secondary | ICD-10-CM | POA: Diagnosis not present

## 2021-05-12 DIAGNOSIS — F320 Major depressive disorder, single episode, mild: Secondary | ICD-10-CM | POA: Diagnosis not present

## 2021-05-19 DIAGNOSIS — F320 Major depressive disorder, single episode, mild: Secondary | ICD-10-CM | POA: Diagnosis not present

## 2021-05-31 DIAGNOSIS — F411 Generalized anxiety disorder: Secondary | ICD-10-CM | POA: Diagnosis not present

## 2021-05-31 DIAGNOSIS — E6609 Other obesity due to excess calories: Secondary | ICD-10-CM | POA: Diagnosis not present

## 2021-05-31 DIAGNOSIS — F320 Major depressive disorder, single episode, mild: Secondary | ICD-10-CM | POA: Diagnosis not present

## 2021-06-02 DIAGNOSIS — F320 Major depressive disorder, single episode, mild: Secondary | ICD-10-CM | POA: Diagnosis not present

## 2021-06-16 DIAGNOSIS — F320 Major depressive disorder, single episode, mild: Secondary | ICD-10-CM | POA: Diagnosis not present

## 2021-06-23 DIAGNOSIS — F320 Major depressive disorder, single episode, mild: Secondary | ICD-10-CM | POA: Diagnosis not present

## 2021-06-30 DIAGNOSIS — F320 Major depressive disorder, single episode, mild: Secondary | ICD-10-CM | POA: Diagnosis not present

## 2021-07-14 DIAGNOSIS — F320 Major depressive disorder, single episode, mild: Secondary | ICD-10-CM | POA: Diagnosis not present

## 2021-07-21 DIAGNOSIS — F320 Major depressive disorder, single episode, mild: Secondary | ICD-10-CM | POA: Diagnosis not present

## 2021-08-18 DIAGNOSIS — F320 Major depressive disorder, single episode, mild: Secondary | ICD-10-CM | POA: Diagnosis not present

## 2021-08-25 DIAGNOSIS — F320 Major depressive disorder, single episode, mild: Secondary | ICD-10-CM | POA: Diagnosis not present

## 2021-10-20 DIAGNOSIS — F320 Major depressive disorder, single episode, mild: Secondary | ICD-10-CM | POA: Diagnosis not present

## 2021-10-23 DIAGNOSIS — H52223 Regular astigmatism, bilateral: Secondary | ICD-10-CM | POA: Diagnosis not present

## 2021-10-23 DIAGNOSIS — H1045 Other chronic allergic conjunctivitis: Secondary | ICD-10-CM | POA: Diagnosis not present

## 2021-10-23 DIAGNOSIS — H04123 Dry eye syndrome of bilateral lacrimal glands: Secondary | ICD-10-CM | POA: Diagnosis not present

## 2021-10-27 DIAGNOSIS — F320 Major depressive disorder, single episode, mild: Secondary | ICD-10-CM | POA: Diagnosis not present

## 2021-11-03 DIAGNOSIS — F320 Major depressive disorder, single episode, mild: Secondary | ICD-10-CM | POA: Diagnosis not present

## 2021-11-22 DIAGNOSIS — F320 Major depressive disorder, single episode, mild: Secondary | ICD-10-CM | POA: Diagnosis not present

## 2021-12-05 DIAGNOSIS — Z01419 Encounter for gynecological examination (general) (routine) without abnormal findings: Secondary | ICD-10-CM | POA: Diagnosis not present

## 2021-12-15 DIAGNOSIS — Z13220 Encounter for screening for lipoid disorders: Secondary | ICD-10-CM | POA: Diagnosis not present

## 2021-12-15 DIAGNOSIS — Z0000 Encounter for general adult medical examination without abnormal findings: Secondary | ICD-10-CM | POA: Diagnosis not present

## 2021-12-15 DIAGNOSIS — R5383 Other fatigue: Secondary | ICD-10-CM | POA: Diagnosis not present

## 2021-12-15 DIAGNOSIS — F411 Generalized anxiety disorder: Secondary | ICD-10-CM | POA: Diagnosis not present

## 2021-12-15 DIAGNOSIS — F320 Major depressive disorder, single episode, mild: Secondary | ICD-10-CM | POA: Diagnosis not present

## 2022-02-02 IMAGING — DX DG ABDOMEN 1V
2 series · 2 of 2 positions shown · non-contrast
Comparison: None.

CLINICAL DATA: Intrauterine contraceptive device threads lost,
initial encounter

EXAM:
ABDOMEN - 1 VIEW

[dg abd 1 view (1 of 2)]
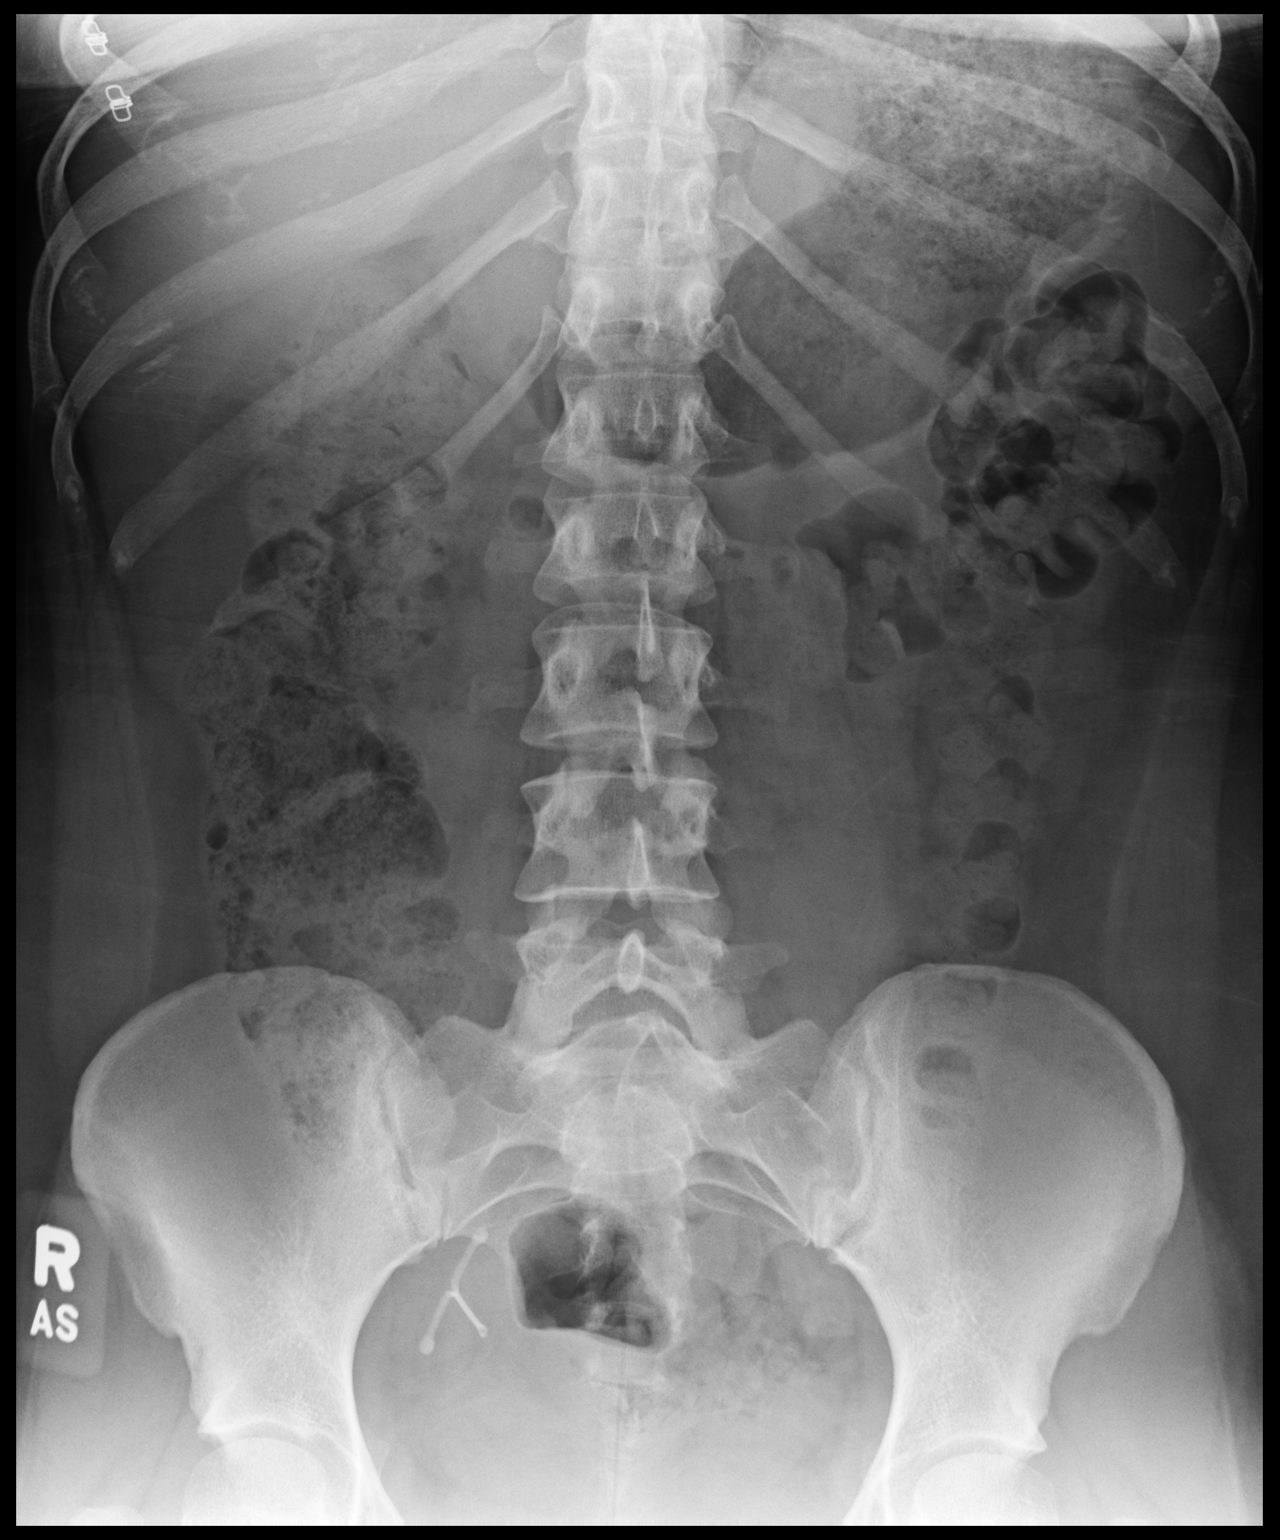

[dg abd 1 view (2 of 2)]
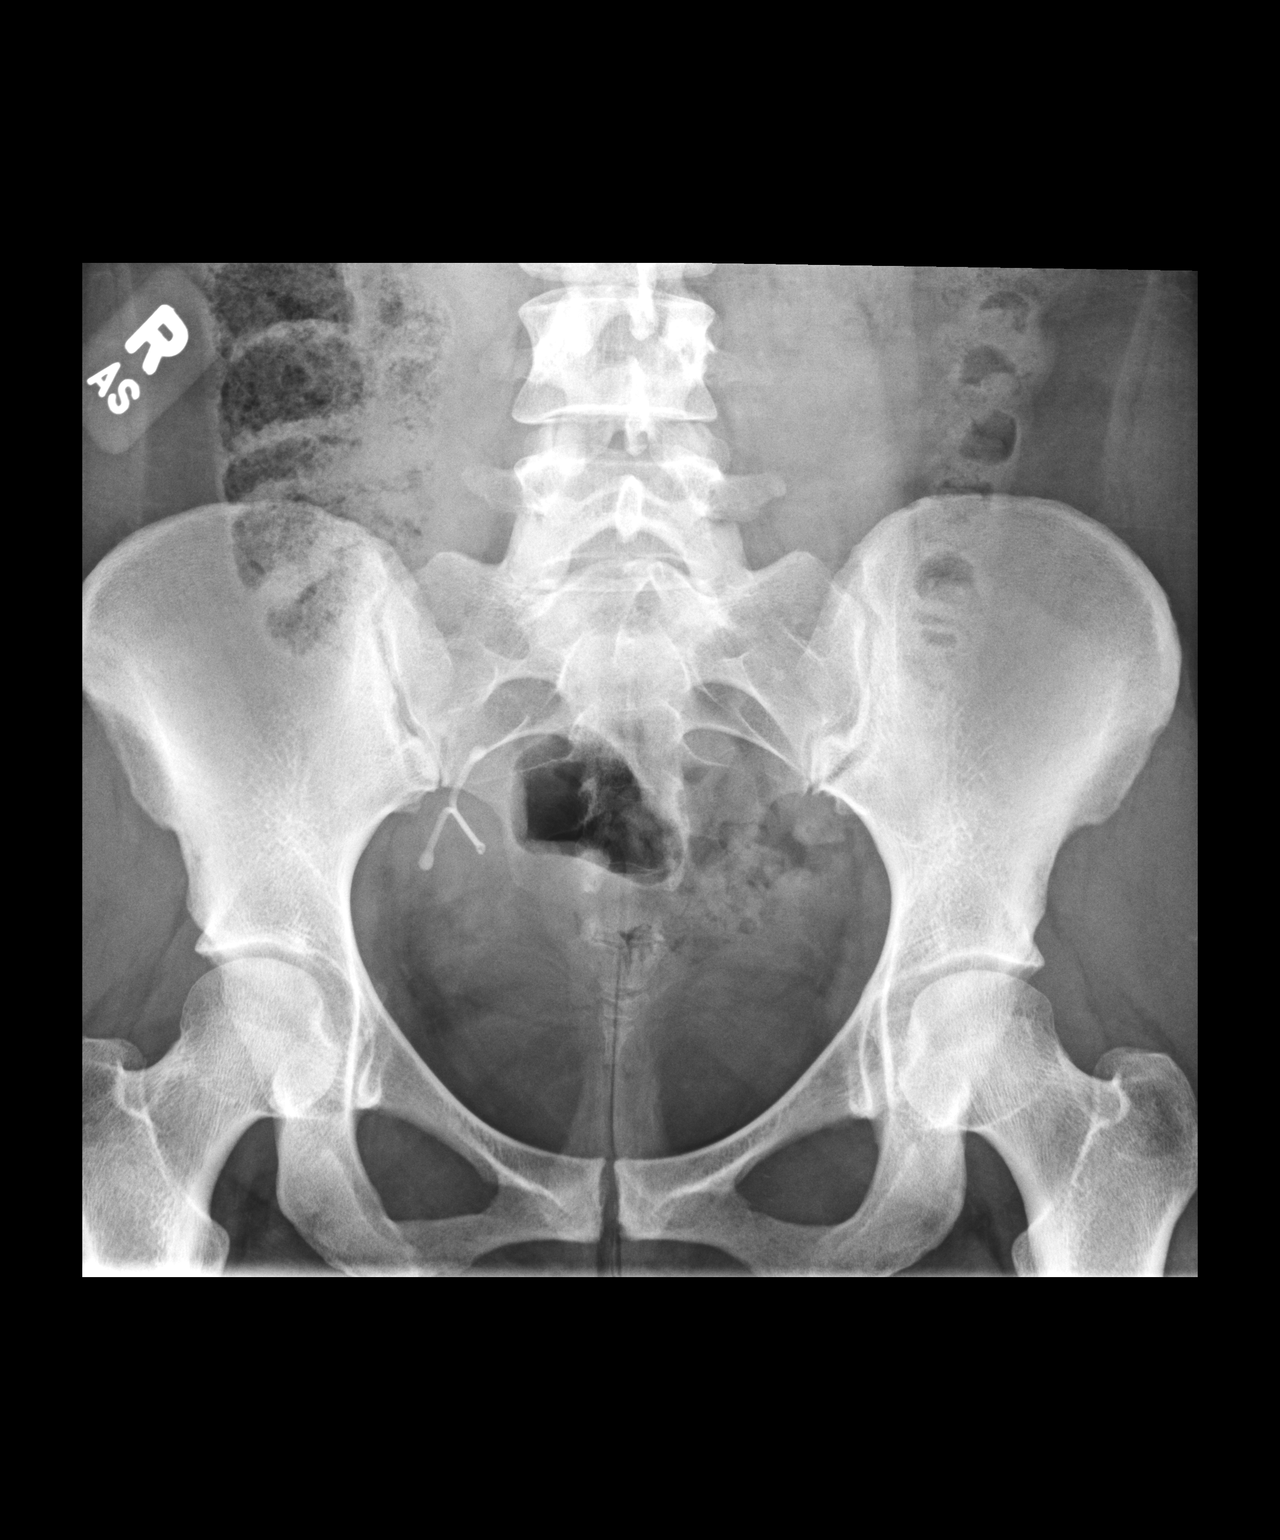

[2 of 2 positions shown; findings below may reference images not displayed]

FINDINGS: Two supine frontal views of the abdomen and pelvis are obtained. The
hemidiaphragms are excluded by collimation. IUD overlies the right
hemipelvis just inferior to the right sacroiliac joint. No bowel
obstruction or ileus. No masses or abnormal calcifications. No acute
bony abnormalities.
IMPRESSION: 1. IUD overlying the superior right hemipelvis, at a level just
inferior to the right SI joint.
2. Unremarkable bowel gas pattern.

## 2022-02-09 DIAGNOSIS — F411 Generalized anxiety disorder: Secondary | ICD-10-CM | POA: Diagnosis not present

## 2022-02-09 DIAGNOSIS — F320 Major depressive disorder, single episode, mild: Secondary | ICD-10-CM | POA: Diagnosis not present

## 2022-02-16 DIAGNOSIS — M79641 Pain in right hand: Secondary | ICD-10-CM | POA: Diagnosis not present

## 2022-04-09 DIAGNOSIS — Z3480 Encounter for supervision of other normal pregnancy, unspecified trimester: Secondary | ICD-10-CM | POA: Diagnosis not present

## 2022-04-12 DIAGNOSIS — O26891 Other specified pregnancy related conditions, first trimester: Secondary | ICD-10-CM | POA: Diagnosis not present

## 2022-04-16 DIAGNOSIS — Z3481 Encounter for supervision of other normal pregnancy, first trimester: Secondary | ICD-10-CM | POA: Diagnosis not present

## 2022-04-18 DIAGNOSIS — Z3481 Encounter for supervision of other normal pregnancy, first trimester: Secondary | ICD-10-CM | POA: Diagnosis not present

## 2022-04-23 ENCOUNTER — Encounter (HOSPITAL_BASED_OUTPATIENT_CLINIC_OR_DEPARTMENT_OTHER): Payer: Self-pay | Admitting: Obstetrics and Gynecology

## 2022-04-23 ENCOUNTER — Other Ambulatory Visit (HOSPITAL_COMMUNITY): Payer: Self-pay | Admitting: Obstetrics and Gynecology

## 2022-04-23 ENCOUNTER — Other Ambulatory Visit: Payer: Self-pay

## 2022-04-23 DIAGNOSIS — O039 Complete or unspecified spontaneous abortion without complication: Secondary | ICD-10-CM

## 2022-04-23 DIAGNOSIS — O3680X0 Pregnancy with inconclusive fetal viability, not applicable or unspecified: Secondary | ICD-10-CM | POA: Diagnosis not present

## 2022-04-23 DIAGNOSIS — Z01818 Encounter for other preprocedural examination: Secondary | ICD-10-CM | POA: Diagnosis not present

## 2022-04-23 NOTE — Progress Notes (Signed)
Spoke w/ via phone for pre-op interview---pt Lab needs dos----   cbc, t & s            Lab results------worked up for syncopy cardiology dr Scharlene Gloss 05-07-2019 none since f/u prn COVID test -----patient states asymptomatic no test needed Arrive at -------1210 04-25-2022 NPO after MN NO Solid Food.  Clear liquids from MN until---1110 am Med rec completed Medications to take morning of surgery -----albuterol inhaler prn bring inhaler Diabetic medication -----n/a Patient instructed no nail polish to be worn day of surgery Patient instructed to bring photo id and insurance card day of surgery Patient aware to have Driver (ride ) / caregiver   evan husband  for 24 hours after surgery  Patient Special Instructions -----none Pre-Op special Instructions -----none Patient verbalized understanding of instructions that were given at this phone interview. Patient denies shortness of breath, chest pain, fever, cough at this phone interview.

## 2022-04-23 NOTE — H&P (Signed)
Marie Morris is an 37 y.o. G2P0010 who is admitted for Dilation and suction curettage under US guidance for missed abortion on ultrasound.  First [place] was performed on 04/12/22 which revealed gestational sac in utero measuring approximately [redacted]w[redacted]d, no fetal pole, fetal heart rate or yolk sac present. Subchorionic hemorrhage noted anterior to gestational sac 2.1cm was noted. On follow-up ultrasound on 04/23/2022, gestational sac was seen again and measured [redacted]w[redacted]d without a fetal pole, yolk sac, or fetal heart rate. Subchronic hemorrhage still visualized now measuring 1.1cm. Patient desires Marie Morris genetic testing. Desires surgical management of missed AB.  Quant HCG trend:  04/18/22: 32,718.00 04/16/22: 24,570.00   Patient Active Problem List   Diagnosis Date Noted   Gestational diabetes 08/12/2020   Obesity (BMI 30.0-34.9) 08/11/2020   Anxiety 08/11/2020   Gestational diabetes mellitus (GDM), antepartum 05/18/2020     MEDICAL/FAMILY/SOCIAL HX: No LMP recorded.    Past Medical History:  Diagnosis Date   Allergies    Anemia    end of pregnancy   Asthma    exercise induced.   Depression    Gestational diabetes     Past Surgical History:  Procedure Laterality Date   LAPAROSCOPY N/A 11/18/2020   Procedure: LAPAROSCOPY DIAGNOSTIC FOR IUD REMOVAL FROM PELVIS.;  Surgeon: Steva Ready, DO;  Location: [facility] OR;  Service: Gynecology;  Laterality: N/A;   NO PAST SURGERIES      Family History  Problem Relation Age of Onset   Hypertension Father     Social History:  reports that she has never smoked. She has never used smokeless tobacco. She reports that she does not currently use alcohol after a past usage of about 2.0 standard drinks of alcohol per week. She reports that she does not use drugs.  ALLERGIES/MEDS:  Allergies:  Allergies  Allergen Reactions   Almond (Diagnostic) Anaphylaxis    Any nuts    Apple Juice Anaphylaxis   Cherry Anaphylaxis   Hazelnut (Filbert) Anaphylaxis    Peanut (Diagnostic) Anaphylaxis   Plum Pulp Anaphylaxis    No medications prior to admission.     Review of Systems  Constitutional: Negative.   HENT: Negative.    Eyes: Negative.   Respiratory: Negative.    Cardiovascular: Negative.   Gastrointestinal: Negative.   Genitourinary: Negative.   Musculoskeletal: Negative.   Skin: Negative.   Neurological: Negative.   Endo/Heme/Allergies: Negative.   Psychiatric/Behavioral: Negative.      currently breastfeeding. Gen:  NAD, pleasant and cooperative Cardio:  RRR Pulm:  CTAB, no wheezes/rales/rhonchi Abd:  Soft, non-distended, non-tender throughout, no rebound/guarding Ext:  No bilateral LE edema, no bilateral calf tenderness  No results found for this or any previous visit (from the past 24 hour(s)).  No results found.   ASSESSMENT/PLAN: Marie Morris is a 37 y.o. G2P0010 who is admitted for Dilation and suction curettage under US guidance for missed abortion on ultrasound.  - Admit to [facility] - Admit labs (CBC, T&S) - Diet:  ERAS pathway/per anesthesia - IVF:  Per anesthesia - VTE Prophylaxis:  SCDs - Antibiotics: Doxycycline  IV on call prior to OR - ANORA order placed - D/C home same-day  Consents: I discussed with the patient that this surgery is performed to remove the products of conception via suction.  Prior to surgery, the risks and benefits of the surgery, as well as alternative treatments, have been discussed.  The risks include, but are not limited to bleeding, including the need for a blood transfusion, infection, damage to  organs and tissues, including uterine perforation, requiring additional surgery, postoperative pain, short-term and long-term, deep vein thrombosis and/or pulmonary embolism, painful intercourse, complications the course of which cannot be predicted or prevented, and death.   Patient was consented for blood products.  The patient is aware that bleeding may result in the need for a blood  transfusion which includes risk of transmission of HIV (1:2 million), Hepatitis C (1:2 million), and Hepatitis B (1:200 thousand) and transfusion reaction.  Patient voiced understanding of the above risks as well as understanding of indications for blood transfusion.  Steva Ready, DO

## 2022-04-25 ENCOUNTER — Other Ambulatory Visit: Payer: Self-pay

## 2022-04-25 ENCOUNTER — Ambulatory Visit (HOSPITAL_COMMUNITY)
Admission: RE | Admit: 2022-04-25 | Discharge: 2022-04-25 | Disposition: A | Discharge status: Home / Self Care | Payer: BC Managed Care – PPO | Source: Ambulatory Visit | Attending: Obstetrics and Gynecology | Admitting: Obstetrics and Gynecology

## 2022-04-25 ENCOUNTER — Ambulatory Visit (HOSPITAL_BASED_OUTPATIENT_CLINIC_OR_DEPARTMENT_OTHER): Payer: BC Managed Care – PPO | Admitting: Anesthesiology

## 2022-04-25 ENCOUNTER — Ambulatory Visit (HOSPITAL_BASED_OUTPATIENT_CLINIC_OR_DEPARTMENT_OTHER)
Admission: RE | Admit: 2022-04-25 | Discharge: 2022-04-25 | Disposition: A | Discharge status: Home / Self Care | Payer: BC Managed Care – PPO | Attending: Obstetrics and Gynecology | Admitting: Obstetrics and Gynecology

## 2022-04-25 ENCOUNTER — Encounter (HOSPITAL_BASED_OUTPATIENT_CLINIC_OR_DEPARTMENT_OTHER)
Admission: RE | Disposition: A | Discharge status: Home / Self Care | Payer: Self-pay | Source: Home / Self Care | Attending: Obstetrics and Gynecology

## 2022-04-25 ENCOUNTER — Encounter (HOSPITAL_BASED_OUTPATIENT_CLINIC_OR_DEPARTMENT_OTHER): Payer: Self-pay | Admitting: Obstetrics and Gynecology

## 2022-04-25 DIAGNOSIS — J45909 Unspecified asthma, uncomplicated: Secondary | ICD-10-CM | POA: Diagnosis not present

## 2022-04-25 DIAGNOSIS — O99519 Diseases of the respiratory system complicating pregnancy, unspecified trimester: Secondary | ICD-10-CM | POA: Diagnosis not present

## 2022-04-25 DIAGNOSIS — E669 Obesity, unspecified: Secondary | ICD-10-CM | POA: Diagnosis not present

## 2022-04-25 DIAGNOSIS — O021 Missed abortion: Secondary | ICD-10-CM | POA: Diagnosis not present

## 2022-04-25 DIAGNOSIS — O24429 Gestational diabetes mellitus in childbirth, unspecified control: Secondary | ICD-10-CM | POA: Diagnosis not present

## 2022-04-25 DIAGNOSIS — Z6838 Body mass index (BMI) 38.0-38.9, adult: Secondary | ICD-10-CM | POA: Insufficient documentation

## 2022-04-25 DIAGNOSIS — O039 Complete or unspecified spontaneous abortion without complication: Secondary | ICD-10-CM

## 2022-04-25 DIAGNOSIS — Z01818 Encounter for other preprocedural examination: Secondary | ICD-10-CM

## 2022-04-25 HISTORY — PX: OPERATIVE ULTRASOUND: SHX5996

## 2022-04-25 HISTORY — PX: DILATION AND EVACUATION: SHX1459

## 2022-04-25 HISTORY — DX: Anxiety disorder, unspecified: F41.9

## 2022-04-25 LAB — TYPE AND SCREEN
ABO/RH(D): O POS
Antibody Screen: NEGATIVE

## 2022-04-25 LAB — CBC
HCT: 38 % (ref 36.0–46.0)
Hemoglobin: 12.9 g/dL (ref 12.0–15.0)
MCH: 29.5 pg (ref 26.0–34.0)
MCHC: 33.9 g/dL (ref 30.0–36.0)
MCV: 86.8 fL (ref 80.0–100.0)
Platelets: 312 10*3/uL (ref 150–400)
RBC: 4.38 MIL/uL (ref 3.87–5.11)
RDW: 13.2 % (ref 11.5–15.5)
WBC: 7.9 10*3/uL (ref 4.0–10.5)
nRBC: 0 % (ref 0.0–0.2)

## 2022-04-25 SURGERY — US INTRAOPERATIVE
Anesthesia: General | Site: Vagina

## 2022-04-25 MED ORDER — LIDOCAINE HCL (PF) 2 % IJ SOLN
INTRAMUSCULAR | Status: AC
  Filled 2022-04-25: qty 10

## 2022-04-25 MED ORDER — ONDANSETRON HCL 4 MG/2ML IJ SOLN
INTRAMUSCULAR | Status: AC
  Filled 2022-04-25: qty 2

## 2022-04-25 MED ORDER — OXYCODONE HCL 5 MG PO TABS: 5.0000 mg | ORAL_TABLET | Freq: Once | ORAL | Status: DC | PRN

## 2022-04-25 MED ORDER — DOXYCYCLINE HYCLATE 100 MG IV SOLR
200.0000 mg | INTRAVENOUS | Status: AC
  Administered 2022-04-25: 200 mg via INTRAVENOUS
  Filled 2022-04-25: qty 200

## 2022-04-25 MED ORDER — CELECOXIB 200 MG PO CAPS
ORAL_CAPSULE | ORAL | Status: AC
  Filled 2022-04-25: qty 1

## 2022-04-25 MED ORDER — ACETAMINOPHEN 500 MG PO TABS
ORAL_TABLET | ORAL | Status: AC
  Filled 2022-04-25: qty 2

## 2022-04-25 MED ORDER — MIDAZOLAM HCL 2 MG/2ML IJ SOLN
INTRAMUSCULAR | Status: AC
  Filled 2022-04-25: qty 2

## 2022-04-25 MED ORDER — ONDANSETRON HCL 4 MG/2ML IJ SOLN
INTRAMUSCULAR | Status: DC | PRN
  Administered 2022-04-25: 4 mg via INTRAVENOUS

## 2022-04-25 MED ORDER — CELECOXIB 200 MG PO CAPS
200.0000 mg | ORAL_CAPSULE | Freq: Once | ORAL | Status: AC
  Administered 2022-04-25: 200 mg via ORAL

## 2022-04-25 MED ORDER — LIDOCAINE HCL (CARDIAC) PF 100 MG/5ML IV SOSY
PREFILLED_SYRINGE | INTRAVENOUS | Status: DC | PRN
  Administered 2022-04-25: 50 mg via INTRAVENOUS

## 2022-04-25 MED ORDER — MIDAZOLAM HCL 2 MG/2ML IJ SOLN
INTRAMUSCULAR | Status: DC | PRN
  Administered 2022-04-25: 2 mg via INTRAVENOUS

## 2022-04-25 MED ORDER — EPHEDRINE 5 MG/ML INJ
INTRAVENOUS | Status: AC
  Filled 2022-04-25: qty 5

## 2022-04-25 MED ORDER — FENTANYL CITRATE (PF) 100 MCG/2ML IJ SOLN
INTRAMUSCULAR | Status: DC | PRN
  Administered 2022-04-25: 25 ug via INTRAVENOUS
  Administered 2022-04-25: 50 ug via INTRAVENOUS
  Administered 2022-04-25: 25 ug via INTRAVENOUS

## 2022-04-25 MED ORDER — SILVER NITRATE-POT NITRATE 75-25 % EX MISC
CUTANEOUS | Status: DC | PRN
  Administered 2022-04-25: 2

## 2022-04-25 MED ORDER — ACETAMINOPHEN 500 MG PO TABS
1000.0000 mg | ORAL_TABLET | Freq: Once | ORAL | Status: AC
  Administered 2022-04-25: 1000 mg via ORAL

## 2022-04-25 MED ORDER — AMISULPRIDE (ANTIEMETIC) 5 MG/2ML IV SOLN: 10.0000 mg | Freq: Once | INTRAVENOUS | Status: DC | PRN

## 2022-04-25 MED ORDER — FENTANYL CITRATE (PF) 100 MCG/2ML IJ SOLN
INTRAMUSCULAR | Status: AC
  Filled 2022-04-25: qty 2

## 2022-04-25 MED ORDER — FENTANYL CITRATE (PF) 100 MCG/2ML IJ SOLN: 25.0000 ug | INTRAMUSCULAR | Status: DC | PRN

## 2022-04-25 MED ORDER — 0.9 % SODIUM CHLORIDE (POUR BTL) OPTIME
TOPICAL | Status: DC | PRN
  Administered 2022-04-25: 1000 mL

## 2022-04-25 MED ORDER — DEXAMETHASONE SODIUM PHOSPHATE 4 MG/ML IJ SOLN
INTRAMUSCULAR | Status: DC | PRN
  Administered 2022-04-25: 8 mg via INTRAVENOUS

## 2022-04-25 MED ORDER — PROPOFOL 10 MG/ML IV BOLUS
INTRAVENOUS | Status: DC | PRN
  Administered 2022-04-25: 200 mg via INTRAVENOUS

## 2022-04-25 MED ORDER — OXYCODONE HCL 5 MG/5ML PO SOLN: 5.0000 mg | Freq: Once | ORAL | Status: DC | PRN

## 2022-04-25 MED ORDER — LACTATED RINGERS IV SOLN: INTRAVENOUS | Status: DC

## 2022-04-25 MED ORDER — ROCURONIUM BROMIDE 10 MG/ML (PF) SYRINGE
PREFILLED_SYRINGE | INTRAVENOUS | Status: AC
  Filled 2022-04-25: qty 20

## 2022-04-25 MED ORDER — ONDANSETRON HCL 4 MG/2ML IJ SOLN
INTRAMUSCULAR | Status: AC
  Filled 2022-04-25: qty 4

## 2022-04-25 MED ORDER — KETOROLAC TROMETHAMINE 30 MG/ML IJ SOLN
INTRAMUSCULAR | Status: AC
  Filled 2022-04-25: qty 5

## 2022-04-25 MED ORDER — IBUPROFEN 800 MG PO TABS: 800.0000 mg | ORAL_TABLET | Freq: Three times a day (TID) | ORAL | 1 refills | Status: AC | PRN

## 2022-04-25 SURGICAL SUPPLY — 22 items
CATH ROBINSON RED A/P 16FR (CATHETERS) ×2 IMPLANT
DRSG TELFA 3X8 NADH STRL (GAUZE/BANDAGES/DRESSINGS) ×1 IMPLANT
FILTER UTR ASPR ASSEMBLY (MISCELLANEOUS) ×1 IMPLANT
GAUZE 4X4 16PLY ~~LOC~~+RFID DBL (SPONGE) ×1 IMPLANT
GLOVE BIO SURGEON STRL SZ 6.5 (GLOVE) ×2 IMPLANT
GLOVE BIOGEL PI IND STRL 6.5 (GLOVE) ×1 IMPLANT
GOWN STRL REUS W/TWL LRG LVL3 (GOWN DISPOSABLE) ×2 IMPLANT
HOSE CONNECTING 18IN BERKELEY (TUBING) ×1 IMPLANT
KIT BERKELEY 1ST TRI 3/8 NO TR (MISCELLANEOUS) ×1 IMPLANT
KIT BERKELEY 1ST TRIMESTER 3/8 (MISCELLANEOUS) ×1 IMPLANT
KIT TURNOVER CYSTO (KITS) ×2 IMPLANT
NS IRRIG 1000ML POUR BTL (IV SOLUTION) ×1 IMPLANT
PACK VAGINAL MINOR WOMEN LF (CUSTOM PROCEDURE TRAY) ×2 IMPLANT
PAD OB MATERNITY 4.3X12.25 (PERSONAL CARE ITEMS) ×1 IMPLANT
SET BERKELEY SUCTION TUBING (SUCTIONS) ×1 IMPLANT
SLEEVE SCD COMPRESS KNEE MED (STOCKING) ×2 IMPLANT
SPIKE FLUID TRANSFER (MISCELLANEOUS) ×2 IMPLANT
UNDERPAD 30X36 HEAVY ABSORB (UNDERPADS AND DIAPERS) ×2 IMPLANT
VACURETTE 10 RIGID CVD (CANNULA) IMPLANT
VACURETTE 7MM CVD STRL WRAP (CANNULA) IMPLANT
VACURETTE 8 RIGID CVD (CANNULA) IMPLANT
VACURETTE 9 RIGID CVD (CANNULA) IMPLANT

## 2022-04-25 NOTE — Op Note (Signed)
Pre Op Dx:   Missed abortion  Post Op Dx:   Same as pre-operative diagnoses  Procedure:   Dilation and suction curettage under ultrasound guidance   Surgeon:  Dr. Steva Ready Assistants:  None Anesthesia:  LMA   EBL:  20cc  IVF:  200cc UOP:  50cc via in-and-out catheter   Drains:  In-and-out foley catheter Specimen removed:  Products of conception - sent to pathology  Fetal tissue/products of conception - sent for [facility] genetic testing Device(s) implanted: None Case Type:  Clean-contaminated Findings:  Normal-appearing cervix. Uterus with thickened endometrium without visualized fetal pole, gestational sac, or yolk sac (poor visualization initially due to lack of acoustic window due to minimal urine in the bladder). Thin uterine stripe noted after multiple passes which was visualized clearly after backfilling the bladder. Complications: None Indications:  37 y.o. G2P1011 with missed abortion on ultrasound who desired surgical management.  Description of each procedure:  After informed consent was obtained the patient was taken to the operating room in the dorsal supine position.  After administration of general anesthesia, the patient was placed in the dorsal lithotomy position and prepped and draped in the usual sterile fashion. A pre-operative time-out was completed. The anterior lip of the cervix was grasped with a single-tooth tenaculum and the cervix was serially dilated to accommodate an 6mm suction cannula. The cannula was gently advanced to the fundus, then withdrawn under suction in a spiral fashion with return of products of conception under ultrasound guidance. Multiple passes were required. The bladder was backfilled with 240cc of saline for better visualization. Hemostasis adequate. Complete uterine evacuation confirmed with thin uterine stripe noted on ultrasound. The single-tooth tenaculum was removed and its sites were made hemostatic using silver nitrate and pressure.   Adequate hemostasis was noted. The bladder was emptied. The patient was awakened and extubated and appeared to have tolerated the procedure well.  All counts were correct.  Disposition:  PACU  Steva Ready, DO

## 2022-04-25 NOTE — Anesthesia Preprocedure Evaluation (Addendum)
Anesthesia Evaluation  Patient identified by MRN, date of birth, ID band Patient awake    Reviewed: Allergy & Precautions, NPO status , Patient's Chart, lab work & pertinent test results  History of Anesthesia Complications Negative for: history of anesthetic complications  Airway Mallampati: II  TM Distance: >3 FB Neck ROM: Full    Dental  (+) Dental Advisory Given, Teeth Intact   Pulmonary asthma    Pulmonary exam normal        Cardiovascular negative cardio ROS Normal cardiovascular exam     Neuro/Psych  PSYCHIATRIC DISORDERS Anxiety Depression    negative neurological ROS     GI/Hepatic negative GI ROS, Neg liver ROS,,,  Endo/Other  diabetes, Gestational   Obesity   Renal/GU negative Renal ROS     Musculoskeletal negative musculoskeletal ROS (+)    Abdominal   Peds  Hematology negative hematology ROS (+)   Anesthesia Other Findings   Reproductive/Obstetrics  Miscarriage                              Anesthesia Physical Anesthesia Plan  ASA: 2  Anesthesia Plan: General   Post-op Pain Management: Tylenol PO (pre-op)* and Celebrex PO (pre-op)*   Induction: Intravenous  PONV Risk Score and Plan: 3 and Treatment may vary due to age or medical condition, Ondansetron, Dexamethasone and Midazolam  Airway Management Planned: LMA  Additional Equipment: None  Intra-op Plan:   Post-operative Plan: Extubation in OR  Informed Consent: I have reviewed the patients History and Physical, chart, labs and discussed the procedure including the risks, benefits and alternatives for the proposed anesthesia with the patient or authorized representative who has indicated his/her understanding and acceptance.     Dental advisory given  Plan Discussed with: CRNA and Anesthesiologist  Anesthesia Plan Comments:        Anesthesia Quick Evaluation

## 2022-04-25 NOTE — Interval H&P Note (Signed)
History and Physical Interval Note:  04/25/2022 12:07 PM  Marie Morris  has presented today for surgery, with the diagnosis of Miscarriage.  The various methods of treatment have been discussed with the patient and family. After consideration of risks, benefits and other options for treatment, the patient has consented to  Procedure(s): OPERATIVE ULTRASOUND (N/A) DILATATION AND SUCTION CURETTAGE (N/A) as a surgical intervention.  The patient's history has been reviewed, patient examined, no change in status, stable for surgery.  I have reviewed the patient's chart and labs.  Questions were answered to the patient's satisfaction.     Steva Ready

## 2022-04-25 NOTE — Discharge Instructions (Signed)
You were given Tylenol prior to your surgery today, you may take Tylenol again as ordered by your physician after 5pm today

## 2022-04-25 NOTE — Transfer of Care (Signed)
Immediate Anesthesia Transfer of Care Note  Patient: Marie Morris  Procedure(s) Performed: OPERATIVE ULTRASOUND (Vagina ) DILATATION AND SUCTION CURETTAGE WITH CHROMOSOME STUDIES (Vagina )  Patient Location: PACU  Anesthesia Type:General  Level of Consciousness: awake and patient cooperative  Airway & Oxygen Therapy: Patient Spontanous Breathing and Patient connected to nasal cannula oxygen  Post-op Assessment: Report given to RN and Post -op Vital signs reviewed and stable  Post vital signs: Reviewed and stable  Last Vitals:  Vitals Value Taken Time  BP    Temp    Pulse    Resp    SpO2      Last Pain:  Vitals:   04/25/22 1047  TempSrc: Oral  PainSc: 0-No pain      Patients Stated Pain Goal: 3 (04/25/22 1047)  Complications: No notable events documented.

## 2022-04-25 NOTE — Anesthesia Procedure Notes (Addendum)
Procedure Name: LMA Insertion Date/Time: 04/25/2022 1:39 PM  Performed by: Earmon Phoenix, CRNAPre-anesthesia Checklist: Patient identified, Emergency Drugs available, Suction available, Patient being monitored and Timeout performed Patient Re-evaluated:Patient Re-evaluated prior to induction Oxygen Delivery Method: Circle system utilized Preoxygenation: Pre-oxygenation with 100% oxygen Induction Type: IV induction Ventilation: Mask ventilation without difficulty LMA: LMA inserted LMA Size: 3.0 Number of attempts: 1 Placement Confirmation: positive ETCO2 and breath sounds checked- equal and bilateral Tube secured with: Tape Dental Injury: Teeth and Oropharynx as per pre-operative assessment

## 2022-04-26 ENCOUNTER — Encounter (HOSPITAL_BASED_OUTPATIENT_CLINIC_OR_DEPARTMENT_OTHER): Payer: Self-pay | Admitting: Obstetrics and Gynecology

## 2022-04-26 LAB — SURGICAL PATHOLOGY

## 2022-04-26 NOTE — Anesthesia Postprocedure Evaluation (Signed)
Anesthesia Post Note  Patient: Marie Morris  Procedure(s) Performed: OPERATIVE ULTRASOUND (Vagina ) DILATATION AND SUCTION CURETTAGE WITH CHROMOSOME STUDIES (Vagina )     Patient location during evaluation: PACU Anesthesia Type: General Level of consciousness: awake and alert Pain management: pain level controlled Vital Signs Assessment: post-procedure vital signs reviewed and stable Respiratory status: spontaneous breathing, nonlabored ventilation and respiratory function stable Cardiovascular status: stable and blood pressure returned to baseline Anesthetic complications: no   No notable events documented.  Last Vitals:  Vitals:   04/25/22 1445 04/25/22 1530  BP:  108/81  Pulse:  78  Resp:  18  Temp: 36.7 C 36.7 C  SpO2:  99%    Last Pain:  Vitals:   04/25/22 1530  TempSrc:   PainSc: 0-No pain                 Beryle Lathe

## 2022-05-13 DIAGNOSIS — G4489 Other headache syndrome: Secondary | ICD-10-CM | POA: Diagnosis not present

## 2022-05-31 DIAGNOSIS — F320 Major depressive disorder, single episode, mild: Secondary | ICD-10-CM | POA: Diagnosis not present

## 2022-05-31 DIAGNOSIS — G44209 Tension-type headache, unspecified, not intractable: Secondary | ICD-10-CM | POA: Diagnosis not present

## 2022-08-17 DIAGNOSIS — F320 Major depressive disorder, single episode, mild: Secondary | ICD-10-CM | POA: Diagnosis not present

## 2022-08-17 DIAGNOSIS — G44209 Tension-type headache, unspecified, not intractable: Secondary | ICD-10-CM | POA: Diagnosis not present

## 2022-08-17 DIAGNOSIS — E559 Vitamin D deficiency, unspecified: Secondary | ICD-10-CM | POA: Diagnosis not present

## 2022-08-17 DIAGNOSIS — E668 Other obesity: Secondary | ICD-10-CM | POA: Diagnosis not present

## 2022-08-30 DIAGNOSIS — G2581 Restless legs syndrome: Secondary | ICD-10-CM | POA: Diagnosis not present

## 2022-08-30 DIAGNOSIS — R0683 Snoring: Secondary | ICD-10-CM | POA: Diagnosis not present

## 2022-10-01 DIAGNOSIS — G4733 Obstructive sleep apnea (adult) (pediatric): Secondary | ICD-10-CM | POA: Diagnosis not present

## 2022-10-08 DIAGNOSIS — G4733 Obstructive sleep apnea (adult) (pediatric): Secondary | ICD-10-CM | POA: Diagnosis not present

## 2022-10-18 DIAGNOSIS — G4733 Obstructive sleep apnea (adult) (pediatric): Secondary | ICD-10-CM | POA: Diagnosis not present

## 2022-11-01 DIAGNOSIS — G4733 Obstructive sleep apnea (adult) (pediatric): Secondary | ICD-10-CM | POA: Diagnosis not present

## 2022-11-18 DIAGNOSIS — G4733 Obstructive sleep apnea (adult) (pediatric): Secondary | ICD-10-CM | POA: Diagnosis not present

## 2022-12-18 DIAGNOSIS — G4733 Obstructive sleep apnea (adult) (pediatric): Secondary | ICD-10-CM | POA: Diagnosis not present

## 2023-01-18 DIAGNOSIS — G4733 Obstructive sleep apnea (adult) (pediatric): Secondary | ICD-10-CM | POA: Diagnosis not present

## 2023-01-21 DIAGNOSIS — F332 Major depressive disorder, recurrent severe without psychotic features: Secondary | ICD-10-CM | POA: Diagnosis not present

## 2023-01-25 DIAGNOSIS — F332 Major depressive disorder, recurrent severe without psychotic features: Secondary | ICD-10-CM | POA: Diagnosis not present

## 2023-02-01 DIAGNOSIS — F332 Major depressive disorder, recurrent severe without psychotic features: Secondary | ICD-10-CM | POA: Diagnosis not present

## 2023-02-07 DIAGNOSIS — Z01419 Encounter for gynecological examination (general) (routine) without abnormal findings: Secondary | ICD-10-CM | POA: Diagnosis not present

## 2023-02-08 DIAGNOSIS — F332 Major depressive disorder, recurrent severe without psychotic features: Secondary | ICD-10-CM | POA: Diagnosis not present

## 2023-02-11 DIAGNOSIS — H6123 Impacted cerumen, bilateral: Secondary | ICD-10-CM | POA: Diagnosis not present

## 2023-02-11 DIAGNOSIS — J0100 Acute maxillary sinusitis, unspecified: Secondary | ICD-10-CM | POA: Diagnosis not present

## 2023-02-13 DIAGNOSIS — G4733 Obstructive sleep apnea (adult) (pediatric): Secondary | ICD-10-CM | POA: Diagnosis not present

## 2023-02-15 DIAGNOSIS — F332 Major depressive disorder, recurrent severe without psychotic features: Secondary | ICD-10-CM | POA: Diagnosis not present

## 2023-02-22 DIAGNOSIS — Z0000 Encounter for general adult medical examination without abnormal findings: Secondary | ICD-10-CM | POA: Diagnosis not present

## 2023-02-22 DIAGNOSIS — F320 Major depressive disorder, single episode, mild: Secondary | ICD-10-CM | POA: Diagnosis not present

## 2023-02-22 DIAGNOSIS — G44209 Tension-type headache, unspecified, not intractable: Secondary | ICD-10-CM | POA: Diagnosis not present

## 2023-02-22 DIAGNOSIS — F419 Anxiety disorder, unspecified: Secondary | ICD-10-CM | POA: Diagnosis not present

## 2023-02-22 DIAGNOSIS — Z13220 Encounter for screening for lipoid disorders: Secondary | ICD-10-CM | POA: Diagnosis not present

## 2023-02-22 DIAGNOSIS — E559 Vitamin D deficiency, unspecified: Secondary | ICD-10-CM | POA: Diagnosis not present

## 2023-03-01 DIAGNOSIS — F332 Major depressive disorder, recurrent severe without psychotic features: Secondary | ICD-10-CM | POA: Diagnosis not present

## 2023-03-08 DIAGNOSIS — F332 Major depressive disorder, recurrent severe without psychotic features: Secondary | ICD-10-CM | POA: Diagnosis not present

## 2023-03-22 DIAGNOSIS — F332 Major depressive disorder, recurrent severe without psychotic features: Secondary | ICD-10-CM | POA: Diagnosis not present

## 2023-03-29 DIAGNOSIS — F332 Major depressive disorder, recurrent severe without psychotic features: Secondary | ICD-10-CM | POA: Diagnosis not present

## 2023-04-05 DIAGNOSIS — F332 Major depressive disorder, recurrent severe without psychotic features: Secondary | ICD-10-CM | POA: Diagnosis not present

## 2023-04-12 DIAGNOSIS — F332 Major depressive disorder, recurrent severe without psychotic features: Secondary | ICD-10-CM | POA: Diagnosis not present

## 2023-04-26 DIAGNOSIS — F332 Major depressive disorder, recurrent severe without psychotic features: Secondary | ICD-10-CM | POA: Diagnosis not present

## 2023-05-10 DIAGNOSIS — F332 Major depressive disorder, recurrent severe without psychotic features: Secondary | ICD-10-CM | POA: Diagnosis not present

## 2023-05-16 DIAGNOSIS — R051 Acute cough: Secondary | ICD-10-CM | POA: Diagnosis not present

## 2023-05-16 DIAGNOSIS — J029 Acute pharyngitis, unspecified: Secondary | ICD-10-CM | POA: Diagnosis not present

## 2023-05-16 DIAGNOSIS — J019 Acute sinusitis, unspecified: Secondary | ICD-10-CM | POA: Diagnosis not present

## 2023-06-05 DIAGNOSIS — Z6834 Body mass index (BMI) 34.0-34.9, adult: Secondary | ICD-10-CM | POA: Diagnosis not present

## 2023-06-05 DIAGNOSIS — R051 Acute cough: Secondary | ICD-10-CM | POA: Diagnosis not present

## 2023-06-05 DIAGNOSIS — R52 Pain, unspecified: Secondary | ICD-10-CM | POA: Diagnosis not present

## 2023-06-05 DIAGNOSIS — J029 Acute pharyngitis, unspecified: Secondary | ICD-10-CM | POA: Diagnosis not present

## 2023-07-19 DIAGNOSIS — F332 Major depressive disorder, recurrent severe without psychotic features: Secondary | ICD-10-CM | POA: Diagnosis not present

## 2023-08-15 DIAGNOSIS — R5383 Other fatigue: Secondary | ICD-10-CM | POA: Diagnosis not present

## 2023-08-15 DIAGNOSIS — E039 Hypothyroidism, unspecified: Secondary | ICD-10-CM | POA: Diagnosis not present

## 2023-08-15 DIAGNOSIS — E639 Nutritional deficiency, unspecified: Secondary | ICD-10-CM | POA: Diagnosis not present

## 2023-08-15 DIAGNOSIS — E559 Vitamin D deficiency, unspecified: Secondary | ICD-10-CM | POA: Diagnosis not present

## 2023-08-15 DIAGNOSIS — E063 Autoimmune thyroiditis: Secondary | ICD-10-CM | POA: Diagnosis not present

## 2023-08-15 DIAGNOSIS — E8849 Other mitochondrial metabolism disorders: Secondary | ICD-10-CM | POA: Diagnosis not present

## 2023-08-15 DIAGNOSIS — E7212 Methylenetetrahydrofolate reductase deficiency: Secondary | ICD-10-CM | POA: Diagnosis not present

## 2023-08-15 DIAGNOSIS — E782 Mixed hyperlipidemia: Secondary | ICD-10-CM | POA: Diagnosis not present

## 2023-08-15 DIAGNOSIS — E790 Hyperuricemia without signs of inflammatory arthritis and tophaceous disease: Secondary | ICD-10-CM | POA: Diagnosis not present

## 2023-08-23 DIAGNOSIS — F332 Major depressive disorder, recurrent severe without psychotic features: Secondary | ICD-10-CM | POA: Diagnosis not present

## 2023-09-05 DIAGNOSIS — L578 Other skin changes due to chronic exposure to nonionizing radiation: Secondary | ICD-10-CM | POA: Diagnosis not present

## 2023-09-05 DIAGNOSIS — L814 Other melanin hyperpigmentation: Secondary | ICD-10-CM | POA: Diagnosis not present

## 2023-09-05 DIAGNOSIS — Q828 Other specified congenital malformations of skin: Secondary | ICD-10-CM | POA: Diagnosis not present

## 2023-09-05 DIAGNOSIS — D225 Melanocytic nevi of trunk: Secondary | ICD-10-CM | POA: Diagnosis not present

## 2023-09-06 DIAGNOSIS — F332 Major depressive disorder, recurrent severe without psychotic features: Secondary | ICD-10-CM | POA: Diagnosis not present

## 2023-09-19 DIAGNOSIS — F332 Major depressive disorder, recurrent severe without psychotic features: Secondary | ICD-10-CM | POA: Diagnosis not present

## 2023-10-04 DIAGNOSIS — F332 Major depressive disorder, recurrent severe without psychotic features: Secondary | ICD-10-CM | POA: Diagnosis not present

## 2023-10-18 DIAGNOSIS — F332 Major depressive disorder, recurrent severe without psychotic features: Secondary | ICD-10-CM | POA: Diagnosis not present

## 2023-10-23 DIAGNOSIS — G44209 Tension-type headache, unspecified, not intractable: Secondary | ICD-10-CM | POA: Diagnosis not present

## 2023-10-23 DIAGNOSIS — E559 Vitamin D deficiency, unspecified: Secondary | ICD-10-CM | POA: Diagnosis not present

## 2023-10-23 DIAGNOSIS — F411 Generalized anxiety disorder: Secondary | ICD-10-CM | POA: Diagnosis not present

## 2023-11-15 DIAGNOSIS — F332 Major depressive disorder, recurrent severe without psychotic features: Secondary | ICD-10-CM | POA: Diagnosis not present

## 2023-12-03 DIAGNOSIS — F419 Anxiety disorder, unspecified: Secondary | ICD-10-CM | POA: Diagnosis not present

## 2023-12-03 DIAGNOSIS — Z3A01 Less than 8 weeks gestation of pregnancy: Secondary | ICD-10-CM | POA: Diagnosis not present

## 2023-12-03 DIAGNOSIS — F320 Major depressive disorder, single episode, mild: Secondary | ICD-10-CM | POA: Diagnosis not present

## 2023-12-03 DIAGNOSIS — E559 Vitamin D deficiency, unspecified: Secondary | ICD-10-CM | POA: Diagnosis not present

## 2023-12-06 DIAGNOSIS — Z3200 Encounter for pregnancy test, result unknown: Secondary | ICD-10-CM | POA: Diagnosis not present

## 2023-12-09 DIAGNOSIS — Z3200 Encounter for pregnancy test, result unknown: Secondary | ICD-10-CM | POA: Diagnosis not present

## 2023-12-18 DIAGNOSIS — J0110 Acute frontal sinusitis, unspecified: Secondary | ICD-10-CM | POA: Diagnosis not present
